# Patient Record
Sex: Female | Born: 1985
Health system: Southern US, Community
[De-identification: ages and names within clinical notes are randomized; demographics above are authoritative.]

## PROBLEM LIST (undated history)

## (undated) DIAGNOSIS — Z789 Other specified health status: Secondary | ICD-10-CM

## (undated) HISTORY — DX: Other specified health status: Z78.9

## (undated) HISTORY — PX: WISDOM TOOTH EXTRACTION: SHX21

## (undated) HISTORY — PX: NO PAST SURGERIES: SHX2092

---

## 2017-05-11 ENCOUNTER — Encounter: Payer: Self-pay | Admitting: Family Medicine

## 2017-05-11 ENCOUNTER — Ambulatory Visit: Payer: BLUE CROSS/BLUE SHIELD | Admitting: Family Medicine

## 2017-05-11 VITALS — BP 102/64 | HR 82 | Temp 98.0°F | Ht 66.0 in | Wt 134.5 lb

## 2017-05-11 DIAGNOSIS — Z0000 Encounter for general adult medical examination without abnormal findings: Secondary | ICD-10-CM | POA: Diagnosis not present

## 2017-05-11 DIAGNOSIS — R300 Dysuria: Secondary | ICD-10-CM

## 2017-05-11 DIAGNOSIS — E559 Vitamin D deficiency, unspecified: Secondary | ICD-10-CM | POA: Diagnosis not present

## 2017-05-11 LAB — LIPID PANEL
CHOL/HDL RATIO: 3
Cholesterol: 130 mg/dL (ref 0–200)
HDL: 51.8 mg/dL (ref >39.00)
LDL CALC: 58 mg/dL (ref 0–99)
NonHDL: 78.34
TRIGLYCERIDES: 103 mg/dL (ref 0.0–149.0)
VLDL: 20.6 mg/dL (ref 0.0–40.0)

## 2017-05-11 LAB — POC URINALSYSI DIPSTICK (AUTOMATED)
Bilirubin, UA: NEGATIVE
Blood, UA: NEGATIVE
GLUCOSE UA: NEGATIVE
KETONES UA: NEGATIVE
LEUKOCYTES UA: NEGATIVE
Nitrite, UA: NEGATIVE
PROTEIN UA: NEGATIVE
SPEC GRAV UA: 1.01 (ref 1.010–1.025)
Urobilinogen, UA: 0.2 E.U./dL
pH, UA: 7.5 (ref 5.0–8.0)

## 2017-05-11 LAB — COMPREHENSIVE METABOLIC PANEL
ALBUMIN: 4.2 g/dL (ref 3.5–5.2)
ALT: 22 U/L (ref 0–35)
AST: 23 U/L (ref 0–37)
Alkaline Phosphatase: 48 U/L (ref 39–117)
BUN: 8 mg/dL (ref 6–23)
CALCIUM: 9.6 mg/dL (ref 8.4–10.5)
CHLORIDE: 103 meq/L (ref 96–112)
CO2: 31 meq/L (ref 19–32)
CREATININE: 0.6 mg/dL (ref 0.40–1.20)
GFR: 124.27 mL/min (ref >60.00)
Glucose, Bld: 92 mg/dL (ref 70–99)
POTASSIUM: 3.8 meq/L (ref 3.5–5.1)
Sodium: 138 mEq/L (ref 135–145)
Total Bilirubin: 0.3 mg/dL (ref 0.2–1.2)
Total Protein: 7.3 g/dL (ref 6.0–8.3)

## 2017-05-11 LAB — CBC
HEMATOCRIT: 40.8 % (ref 36.0–46.0)
HEMOGLOBIN: 13.4 g/dL (ref 12.0–15.0)
MCHC: 32.9 g/dL (ref 30.0–36.0)
MCV: 95.9 fl (ref 78.0–100.0)
PLATELETS: 250 10*3/uL (ref 150.0–400.0)
RBC: 4.26 Mil/uL (ref 3.87–5.11)
RDW: 12.7 % (ref 11.5–15.5)
WBC: 5.8 10*3/uL (ref 4.0–10.5)

## 2017-05-11 LAB — VITAMIN D 25 HYDROXY (VIT D DEFICIENCY, FRACTURES): VITD: 24.79 ng/mL — ABNORMAL LOW (ref 30.00–100.00)

## 2017-05-11 NOTE — Progress Notes (Signed)
Chief Complaint  Patient presents with  . Establish Care     Well Woman Brandi Murray is here for a complete physical.   Her last physical was >1 year ago.  Current diet: in general, a "healthy" diet. Current exercise: walk. Weight is stable and she denies daytime fatigue. No LMP recorded.  Seatbelt? Yes  Health Maintenance Tetanus- Yes; does not remember  HIV screening- Yes- 6 mo ago  Past Medical History:  Diagnosis Date  . No known health problems      Past Surgical History:  Procedure Laterality Date  . NO PAST SURGERIES      Medications  Current Outpatient Medications on File Prior to Visit  Medication Sig Dispense Refill  . cholecalciferol (VITAMIN D) 1000 units tablet Take 1,000 Units by mouth daily.    Marland Kitchen loratadine (CLARITIN) 10 MG tablet Take 10 mg by mouth daily.    . Prenatal Multivit-Min-Fe-FA (PRENATAL VITAMINS PO) Take 1 tablet by mouth.     Allergies No Known Allergies  Review of Systems: Constitutional:  no fevers Eye:  no recent significant change in vision Ear/Nose/Mouth/Throat:  Ears:  no tinnitus or vertigo and no recent change in hearing, Nose/Mouth/Throat:  no complaints of nasal congestion, no sore throat Cardiovascular: no chest pain, no palpitations Respiratory:  no cough and no shortness of breath Gastrointestinal:  no abdominal pain, no change in bowel habits, no significant change in appetite, no nausea, vomiting, diarrhea, or constipation and no black or bloody stool GU:  Female: +dysuria, neg for frequency, and incontinence; no abnormal bleeding, pelvic pain, or discharge Musculoskeletal/Extremities:  no pain of the joints Integumentary (Skin/Breast):  no abnormal skin lesions reported Neurologic:  no headaches Endocrine:  denies fatigue Hematologic/Lymphatic:  no unexpected weight changes  Exam BP 102/64 (BP Location: Left Arm, Patient Position: Sitting, Cuff Size: Normal)   Pulse 82   Temp 98 F (36.7 C) (Oral)   Ht 5\' 6"  (1.676  m)   Wt 134 lb 8 oz (61 kg)   SpO2 94%   BMI 21.71 kg/m  General:  well developed, well nourished, in no apparent distress Skin:  no significant moles, warts, or growths Head:  no masses, lesions, or tenderness Eyes:  pupils equal and round, sclera anicteric without injection Ears:  canals without lesions, TMs shiny without retraction, no obvious effusion, no erythema Nose:  nares patent, septum midline, mucosa normal, and no drainage or sinus tenderness Throat/Pharynx:  lips and gingiva without lesion; tongue and uvula midline; non-inflamed pharynx; no exudates or postnasal drainage Neck: neck supple without adenopathy, thyromegaly, or masses Breasts:  Not done Thorax:  nontender Lungs:  clear to auscultation, breath sounds equal bilaterally, no respiratory distress Cardio:  regular rate and rhythm without murmurs, heart sounds without clicks or rubs, point of maximal impulse normal; no lifts, heaves, or thrills Abdomen:  abdomen soft, nontender; bowel sounds normal; no masses or organomegaly Genital: Defer to GYN Musculoskeletal:  symmetrical muscle groups noted without atrophy or deformity Extremities:  no clubbing, cyanosis, or edema, no deformities, no skin discoloration Neuro:  gait normal; deep tendon reflexes normal and symmetric Psych: well oriented with normal range of affect and appropriate judgment/insight  Assessment and Plan  Well adult exam - Plan: CBC, Comprehensive metabolic panel, Lipid panel  Vitamin D insufficiency - Plan: Vitamin D (25 hydroxy)  Dysuria - Plan: POCT Urinalysis Dipstick (Automated), Urine Culture   Well 32 y.o. female. Counseled on diet and exercise. UA neg. Ck culture.  Other orders as  above. Info for OB/GYN across hall given. Follow up in 1 yr or prn. The patient voiced understanding and agreement to the plan.  Jilda Rocheicholas Paul DaleWendling, DO 05/11/17 3:11 PM

## 2017-05-11 NOTE — Patient Instructions (Addendum)
Call Center for [facility] at [phone] for an appointment.  They are located at [address], [address], [place], [place], [phone] (right across the hall from our office).  Give us 2-3 business days to get the results of your labs back. If labs are normal, you will likely receive a letter in the mail unless you have MyChart. This can take longer than 2-3 business days.   We will let you know the results of the urine by the end of the day. If everything is normal, be diligent with your water intake.   Great work with walking. Consider lifting weights.   Let us know if you need anything.

## 2017-05-11 NOTE — Progress Notes (Signed)
Pre visit review using our clinic review tool, if applicable. No additional management support is needed unless otherwise documented below in the visit note. 

## 2017-05-12 ENCOUNTER — Other Ambulatory Visit: Payer: Self-pay | Admitting: Family Medicine

## 2017-05-12 DIAGNOSIS — E559 Vitamin D deficiency, unspecified: Secondary | ICD-10-CM

## 2017-05-12 LAB — URINE CULTURE
MICRO NUMBER: 90053920
Result:: NO GROWTH
SPECIMEN QUALITY:: ADEQUATE

## 2017-05-13 ENCOUNTER — Telehealth: Payer: Self-pay | Admitting: *Deleted

## 2017-05-13 NOTE — Telephone Encounter (Signed)
[facility]; forwarded to provider/SLS 01/16

## 2017-06-01 ENCOUNTER — Encounter: Payer: Self-pay | Admitting: Obstetrics & Gynecology

## 2017-06-01 ENCOUNTER — Ambulatory Visit: Payer: BLUE CROSS/BLUE SHIELD | Admitting: Obstetrics & Gynecology

## 2017-06-01 VITALS — BP 117/65 | HR 70 | Ht 66.0 in | Wt 133.0 lb

## 2017-06-01 DIAGNOSIS — N979 Female infertility, unspecified: Secondary | ICD-10-CM

## 2017-06-01 DIAGNOSIS — Z3169 Encounter for other general counseling and advice on procreation: Secondary | ICD-10-CM | POA: Diagnosis not present

## 2017-06-01 DIAGNOSIS — N946 Dysmenorrhea, unspecified: Secondary | ICD-10-CM | POA: Diagnosis not present

## 2017-06-01 NOTE — Progress Notes (Signed)
Patient states she has been trying to get pregnant since April 2018. Brandi StammerJennifer Wakisha Alberts RN

## 2017-06-01 NOTE — Progress Notes (Signed)
Subjective:     Brandi Murray is a 32 y.o. female here for a routine exam. G0 LMP 05/23/2017 q26-30 days. Current complaints: none.  Married. Attempting conception since April 2018 (~848months). Pt husband no children.  Both are healthy. Pt used the Nuvaring for <2 years and stopped in 2016 but, used the withdrawal method for 2 years. Stopped using the withdrawal method in April of 2018. Pts husband has a semens analysis which showed decreased sperm concentration and morphology but, normal motility. He is scheduled to see the urologist. He was supposed to see the urology. He has not seen urology and or had the second test.     Pt reports that her menses are the same monthly. She reports mild pain not requiring meds. Pt reports getting angry around the time of her menses.        Gynecologic History Patient's last menstrual period was 06/23/2016. Contraception: none [place] Results were: normal Last mammogram: n/a.  Obstetric History OB History  Gravida Para Term Preterm AB Living  0 0 0 0 0 0  SAB TAB Ectopic Multiple Live Births  0 0 0 0 0       The following portions of the patient's history were reviewed and updated as appropriate: allergies, current medications, past family history, past medical history, past social history, past surgical history and problem list.  Review of Systems Pertinent items are noted in HPI.    Objective:  BP 117/65   Pulse 70   Ht 5\' 6"  (1.676 m)   Wt 133 lb (60.3 kg)   LMP 06/23/2016   BMI 21.47 kg/m  CONSTITUTIONAL: Well-developed, well-nourished female in no acute distress.  HENT:  Normocephalic, atraumatic EYES: Conjunctivae and EOM are normal. No scleral icterus.  NECK: Normal range of motion SKIN: Skin is warm and dry. No rash noted. Not diaphoretic.No pallor. NEUROLGIC: Alert and oriented to person, place, and time. Normal coordination.    Assessment:   Inability to conceive for 8 months. Pt had questions re conception  and ovulation. She felt that she did not really know what the process was Abnormal semen analysis in spouse.   I suspect ovulatory cycles given pts sx.     Plan:   Reviewed ovulation and conception Rec repeat semen analysis.  I have offered to order this if pt has difficulty obtained the results.  I do not think that pt needs lab tests done at this time. She concurred. She will fax me the results of her husbands semen analysis F/u in 4 months or sooner prn Cont pnv  Total face-to-face time with patient was 30 min.  Greater than 50% was spent in counseling and coordination of care with the patient.   Mohamed Portlock L. Harraway-Smith, M.D., Evern CoreFACOG

## 2017-08-10 ENCOUNTER — Other Ambulatory Visit (INDEPENDENT_AMBULATORY_CARE_PROVIDER_SITE_OTHER): Payer: BLUE CROSS/BLUE SHIELD

## 2017-08-10 DIAGNOSIS — E559 Vitamin D deficiency, unspecified: Secondary | ICD-10-CM

## 2017-08-10 LAB — VITAMIN D 25 HYDROXY (VIT D DEFICIENCY, FRACTURES): VITD: 30.75 ng/mL (ref 30.00–100.00)

## 2017-08-12 ENCOUNTER — Other Ambulatory Visit: Payer: Self-pay | Admitting: Family Medicine

## 2017-08-12 DIAGNOSIS — E559 Vitamin D deficiency, unspecified: Secondary | ICD-10-CM

## 2017-09-04 ENCOUNTER — Ambulatory Visit: Payer: BLUE CROSS/BLUE SHIELD | Admitting: Obstetrics & Gynecology

## 2017-09-04 ENCOUNTER — Encounter: Payer: Self-pay | Admitting: Obstetrics & Gynecology

## 2017-09-04 VITALS — BP 102/64 | HR 77 | Ht 66.0 in | Wt 132.0 lb

## 2017-09-04 DIAGNOSIS — N979 Female infertility, unspecified: Secondary | ICD-10-CM

## 2017-09-04 NOTE — Patient Instructions (Signed)
Clomiphene tablets Qu es este medicamento? El CLOMIFENO es un medicamento para la fertilidad que se utiliza para aumentar la posibilidad de quedar embarazada. Se usa para estimular una ovulacin (producir un huevo maduro) adecuada durante el ciclo de la mujer. Este medicamento puede ser utilizado para otros usos; si tiene alguna pregunta consulte con su proveedor de atencin mdica o con su farmacutico. MARCAS COMUNES: Clomid, Serophene Qu le debo informar a mi profesional de la salud antes de tomar este medicamento? Necesita saber si usted presenta alguno de los siguientes problemas o situaciones: -enfermedad de la glndula suprarrenal -enfermedad vascular, trastorno de coagulacin sangunea -quiste en los ovarios -endometriosis -enfermedad heptica -carcinoma de ovario -enfermedad de la glndula pituitaria -sangrado vaginal que no ha sido evaluado -una reaccin alrgica o inusual al clomifeno, a otros medicamentos, alimentos, colorantes o conservantes -si est embarazada (no debe usar si est embarazada) -si est amamantando a un beb Cmo debo utilizar este medicamento? Tome este medicamento por va oral con un vaso de agua. Siga las instrucciones de la etiqueta del medicamento. Tomar exactamente segn se indica y durante el nmero exacto de das para los que fue recetado. Tome sus dosis a intervalos regulares. La mayora de las mujeres toman este medicamento durante un perodo de 5 das, pero la duracin del tratamiento puede ajustarse en algunos casos. Su mdico le indicarn el da en que debe empezar a tomar este medicamento y le darn las indicaciones para el seguimiento. No tome su medicamento con una frecuencia mayor que la indicada. Hable con su pediatra para informarse acerca del uso de este medicamento en nios. Puede requerir atencin especial. Sobredosis: Pngase en contacto inmediatamente con un centro toxicolgico o una sala de urgencia si usted cree que haya tomado  demasiado medicamento. ATENCIN: Este medicamento es solo para usted. No comparta este medicamento con nadie. Qu sucede si me olvido de una dosis? Si olvida una dosis, tmela lo antes posible. Si es casi la hora de la prxima dosis, tome slo esa dosis. No tome dosis adicionales o dobles. Qu puede interactuar con este medicamento? -suplementos a base de hierbas o dietticos como cohosh azul, cohosh negro, vitex o DHEA -prasterona Puede ser que esta lista no menciona todas las posibles interacciones. Informe a su profesional de la salud de todos los productos a base de hierbas, medicamentos de venta libre o suplementos nutritivos que est tomando. Si usted fuma, consume bebidas alcohlicas o si utiliza drogas ilegales, indqueselo tambin a su profesional de la salud. Algunas sustancias pueden interactuar con su medicamento. A qu debo estar atento al usar este medicamento? Asegrese que usted sepa cmo y cundo usar este medicamento. Debe saber cundo est ovulando y cundo debe tener relaciones sexuales a fin de incrementar las posibilidades de quedar embarazada. Visite a su mdico o a su profesional de la salud para chequear su evolucin peridicamente. Es posible que deba controlar sus niveles de hormonas en la sangre o que le indique alguna prueba de orina domiciliaria para controlar la ovulacin. Trate de no faltar a las citas. En comparacin con otros tratamientos para la fertilidad, este medicamento no aumenta mucho la posibilidad de tener un embarazo mltiple. Aproximadamente 5 de cada 100 mujeres que toman este medicamento tienen la posibilidad de quedar embarazadas con mellizos. Si piensa que est embarazada deje de tomar este medicamento de inmediato y comunquese con su mdico o con su profesional de la salud. Este medicamento no es para tratamientos a largo plazo. La mayora de las mujeres que se benefician   del uso de este medicamento obtienen resultados dentro de los tres primeros  ciclos (meses). Su mdico o su profesional de la salud volver a evaluar su problema. Este medicamento generalmente se utiliza durante no ms de 6 ciclos de tratamiento. Puede experimentar mareos o somnolencia. No conduzca ni utilice maquinaria ni haga nada que le exija permanecer en estado de alerta hasta que sepa cmo le afecta este medicamento. No se siente ni se ponga de pie con rapidez. Esto reduce el riesgo de mareos o desmayos. El consumir bebidas alcohlicas o el fumar tabaco puede disminuir las posibilidades de quedar embarazada. Limitar o dejar de consumir alcohol o el uso de tabaco durante su tratamiento para la fertilidad. Qu efectos secundarios puedo tener al utilizar este medicamento? Efectos secundarios que debe informar a su mdico o a su profesional de la salud tan pronto como sea posible: -reacciones alrgicas como erupcin cutnea, picazn o urticarias, hinchazn de la cara, labios o lengua -problemas respiratorios -cambios en la visin -retencin de lquidos -nuseas, vmito -hinchazn o dolor plvico -dolor abdominal severo -aumento de peso repentino Efectos secundarios que, por lo general, no requieren atencin mdica (debe informarlos a su mdico o a su profesional de la salud si persisten o si son molestos): -molestia en las mamas -sofocos -molestia plvica leve -nuseas leves Puede ser que esta lista no menciona todos los posibles efectos secundarios. Comunquese a su mdico por asesoramiento mdico sobre los efectos secundarios. Usted puede informar los efectos secundarios a la FDA por telfono al 1-800-FDA-1088. Dnde debo guardar mi medicina? Mantngala fuera del alcance de los nios. Gurdela a temperatura ambiente, entre 15 y 30 grados C (59 y 86 grados F). Protjala del calor, la luz y la humedad. Deseche todo el medicamento que no haya utilizado, despus de la fecha de vencimiento. ATENCIN: Este folleto es un resumen. Puede ser que no cubra toda la posible  informacin. Si usted tiene preguntas acerca de esta medicina, consulte con su mdico, su farmacutico o su profesional de la salud.  2018 Elsevier/Gold Standard (2014-06-06 00:00:00)  

## 2017-09-04 NOTE — Progress Notes (Signed)
History:  32 y.o. G0P0000 here today for eval of inability to conceive. She reports that her husband went to the urologist for a repeat semen analysis and report that he is fine. She reports that her she started following her ovulation and has still not conceived. She feels that she can tell when she ovulates. There is no change in her clonically since her last visit.     The following portions of the patient's history were reviewed and updated as appropriate: allergies, current medications, past family history, past medical history, past social history, past surgical history and problem list.  Review of Systems:  Pertinent items are noted in HPI.    Objective:  Physical Exam Blood pressure 102/64, pulse 77, height  (1.676 m), weight 132 lb (59.9 kg), last menstrual period 08/10/2017.  CONSTITUTIONAL: Well-developed, well-nourished female in no acute distress.  HENT:  Normocephalic, atraumatic EYES: Conjunctivae and EOM are normal. No scleral icterus.  NECK: Normal range of motion SKIN: Skin is warm and dry. No rash noted. Not diaphoretic.No pallor. NEUROLGIC: Alert and oriented to person, place, and time. Normal coordination.   Labs and Imaging No results found.  Assessment & Plan:  Primary infertility.   Spouse was seen by urology and said there is no problem  Lab eval: TSH, DHEAS, Prolactin, FSH/LH, testosterone today   Pt will decide if she want to use Clomid if her labs are WNL.  She will call for meds after labs. I have reviewed with her the potential side effect and gave her a handout to review.  She will go over this with her spouse. Pt will send MyChart msg if she want to begin meds F/u in 4 months or sooner prn.   Total face-to-face time with patient was 18 min.  Greater than 50% was spent in counseling and coordination of care with the patient.    Peyson Delao L. Harraway-Smith, M.D., [facility]

## 2017-09-05 LAB — FOLLICLE STIMULATING HORMONE: FSH: 3.4 m[IU]/mL

## 2017-09-05 LAB — TESTOSTERONE: TESTOSTERONE: 4 ng/dL — AB (ref 8–48)

## 2017-09-05 LAB — LUTEINIZING HORMONE: LH: 4.9 m[IU]/mL

## 2017-09-05 LAB — DHEA-SULFATE: DHEA-SO4: 74.6 ug/dL — ABNORMAL LOW (ref 84.8–378.0)

## 2017-09-05 LAB — TSH: TSH: 1.56 u[IU]/mL (ref 0.450–4.500)

## 2017-09-05 LAB — PROLACTIN: PROLACTIN: 18.6 ng/mL (ref 4.8–23.3)

## 2017-09-07 ENCOUNTER — Encounter: Payer: Self-pay | Admitting: Obstetrics & Gynecology

## 2017-11-11 ENCOUNTER — Other Ambulatory Visit (INDEPENDENT_AMBULATORY_CARE_PROVIDER_SITE_OTHER): Payer: BLUE CROSS/BLUE SHIELD

## 2017-11-11 DIAGNOSIS — E559 Vitamin D deficiency, unspecified: Secondary | ICD-10-CM

## 2017-11-11 LAB — VITAMIN D 25 HYDROXY (VIT D DEFICIENCY, FRACTURES): VITD: 34.77 ng/mL (ref 30.00–100.00)

## 2018-01-25 ENCOUNTER — Encounter: Payer: Self-pay | Admitting: Obstetrics & Gynecology

## 2018-01-25 ENCOUNTER — Ambulatory Visit (INDEPENDENT_AMBULATORY_CARE_PROVIDER_SITE_OTHER): Payer: BLUE CROSS/BLUE SHIELD | Admitting: Obstetrics & Gynecology

## 2018-01-25 VITALS — BP 106/59 | HR 63 | Ht 66.0 in | Wt 128.1 lb

## 2018-01-25 DIAGNOSIS — Z01419 Encounter for gynecological examination (general) (routine) without abnormal findings: Secondary | ICD-10-CM | POA: Diagnosis not present

## 2018-01-25 DIAGNOSIS — N979 Female infertility, unspecified: Secondary | ICD-10-CM

## 2018-01-25 DIAGNOSIS — Z124 Encounter for screening for malignant neoplasm of cervix: Secondary | ICD-10-CM | POA: Diagnosis not present

## 2018-01-25 DIAGNOSIS — Z1151 Encounter for screening for human papillomavirus (HPV): Secondary | ICD-10-CM | POA: Diagnosis not present

## 2018-01-25 DIAGNOSIS — N944 Primary dysmenorrhea: Secondary | ICD-10-CM

## 2018-01-25 NOTE — Progress Notes (Signed)
Subjective:     Brandi Murray is a 32 y.o. female here for a routine exam.G0  LMP 01/10/2018 Pt reports 26-29 day cycles that lasting 4-5 days. Cycles are painful. There is some pain between cycles- at ovulation. No pain with intercourse or daily activities. Pt sexually active for 1 1/2 with no conception and no birth control.  Pts spouse had a semen analysis <1 year prev that was WNL.  Pt denies h/o pelvic infections.     Gynecologic History Patient's last menstrual period was 01/10/2018. Contraception: none Last Pap: 11/2017. Results were: normal Last mammogram: n/a.  Obstetric History OB History  Gravida Para Term Preterm AB Living  0 0 0 0 0 0  SAB TAB Ectopic Multiple Live Births  0 0 0 0 0   The following portions of the patient's history were reviewed and updated as appropriate: allergies, current medications, past family history, past medical history, past social history, past surgical history and problem list.  Review of Systems Pertinent items are noted in HPI.    Objective:  BP (!) 106/59   Pulse 63   Ht 5\' 6"  (1.676 m)   Wt 128 lb 1.3 oz (58.1 kg)   LMP 01/10/2018   BMI 20.67 kg/m   General Appearance:    Alert, cooperative, no distress, appears stated age  Head:    Normocephalic, without obvious abnormality, atraumatic  Eyes:    conjunctiva/corneas clear, EOM's intact, both eyes  Ears:    Normal external ear canals, both ears  Nose:   Nares normal, septum midline, mucosa normal, no drainage    or sinus tenderness  Throat:   Lips, mucosa, and tongue normal; teeth and gums normal  Neck:   Supple, symmetrical, trachea midline, no adenopathy;    thyroid:  no enlargement/tenderness/nodules  Back:     Symmetric, no curvature, ROM normal, no CVA tenderness  Lungs:     Clear to auscultation bilaterally, respirations unlabored  Chest Wall:    No tenderness or deformity   Heart:    Regular rate and rhythm, S1 and S2 normal, no murmur, rub   or gallop  Breast Exam:    No  tenderness, masses, or nipple abnormality  Abdomen:     Soft, non-tender, bowel sounds active all four quadrants,    no masses, no organomegaly  Genitalia:    Normal female without lesion, discharge or tenderness; uterus- small; mobile.        Extremities:   Extremities normal, atraumatic, no cyanosis or edema  Pulses:   2+ and symmetric all extremities  Skin:   Skin color, texture, turgor normal, no rashes or lesions     Assessment:    Healthy female exam.   Primary infertility- pt wants to confirm that she is healthy. Lab eval was WNL. She wants to eval for tubal patency. Her partner has a normal semen analysis within the year.   She was taking PNV but, felts increased pain with ovulation so stopped them      Plan:   Scheduled HSG F/u in 6 weeks Reviewed timing of intercourse.   Pt offered Letrozole or clomid. She will decide after HSG. She is worried about side effects PV- reviewed benefits.   F/u PAP  Tessi Eustache L. Harraway-Smith, M.D., [facility]

## 2018-01-25 NOTE — Patient Instructions (Signed)
Hysterosalpingography Hysterosalpingography is a procedure to look inside your uterus and fallopian tubes. During this procedure, contrast dye is injected into your uterus through your vagina and cervix to illuminate your uterus while X-ray pictures are taken. This procedure may help your health care provider determine whether you have uterine tumors, adhesions, or structural abnormalities. It is commonly used to help determine why a woman is unable to have children (infertility). The procedure usually lasts about 15-30 minutes. LET YOUR HEALTH CARE PROVIDER KNOW ABOUT:  Any allergies you have.  All medicines you are taking, including vitamins, herbs, eye drops, creams, and over-the-counter medicines.  Previous problems you or members of your family have had with the use of anesthetics.  Any blood disorders you have.  Previous surgeries you have had.  Medical conditions you have. RISKS AND COMPLICATIONS Generally, this is a safe procedure. However, as with any procedure, problems can occur. Possible problems include:  Infection in the lining of the uterus (endometritis) or fallopian tubes (salpingitis).  Damage or perforation of the uterus or fallopian tubes.  An allergic reaction to the contrast dye used to perform the X-ray.  BEFORE THE PROCEDURE  Schedule the procedure after your period stops, but before your next ovulation. This is usually between day 5 and day 10 of your last period. Day 1 is the first day of your period.  Ask your health care provider about changing or stopping your regular medicines.  You may eat and drink as normal.  Empty your bladder before the procedure begins. PROCEDURE  You may be given a medicine to relax you (sedative) or an over-the-counter pain medicine to lessen any discomfort during the procedure.  You will lie down on an X-ray table with your feet in stirrups.  A device called a speculum will be placed into your vagina. This allows your  health care provider to see inside your vagina to the cervix.  The cervix will be washed with a special soap.  A thin, flexible tube will be passed through the cervix into the uterus.  Contrast dye will be put into this tube.  Several X-rays will be taken as the contrast dye spreads through the uterus and fallopian tubes.  The tube will be taken out after the procedure. What to expect after the procedure  Most of the contrast dye will flow out of your vagina naturally. You may want to wear a sanitary pad.  You may feel mild cramping and notice a little bleeding from your vagina. This should go away in 24 hours.  Ask when your test results will be ready. Make sure you get your test results. This information is not intended to replace advice given to you by your health care provider. Make sure you discuss any questions you have with your health care provider. Document Released: 05/17/2004 Document Revised: 09/20/2015 Document Reviewed: 10/15/2012 Elsevier Interactive Patient Education  2017 Elsevier Inc.  

## 2018-01-26 ENCOUNTER — Telehealth: Payer: Self-pay | Admitting: Obstetrics & Gynecology

## 2018-01-26 NOTE — Telephone Encounter (Signed)
Called patient after attempted to schedule her HSG study.  This imaging study can only be done 7-10 after her menstrual cycle.  Patient was given the scheduling direct number.  She will contact them on the first day of her next period to schedule this procedure.    Number to Central scheduling is [phone].

## 2018-01-26 NOTE — Addendum Note (Signed)
Addended by: Anell Barr on: 01/26/2018 11:35 AM   Modules accepted: Orders

## 2018-01-27 LAB — CYTOLOGY - PAP
Diagnosis: NEGATIVE
HPV (WINDOPATH): NOT DETECTED

## 2018-02-12 ENCOUNTER — Ambulatory Visit (HOSPITAL_COMMUNITY)
Admission: RE | Admit: 2018-02-12 | Discharge: 2018-02-12 | Disposition: A | Discharge status: Home / Self Care | Payer: BLUE CROSS/BLUE SHIELD | Source: Ambulatory Visit | Attending: Obstetrics & Gynecology | Admitting: Obstetrics & Gynecology

## 2018-02-12 DIAGNOSIS — N979 Female infertility, unspecified: Secondary | ICD-10-CM | POA: Diagnosis not present

## 2018-02-12 MED ORDER — IOPAMIDOL (ISOVUE-300) INJECTION 61%
25.0000 mL | Freq: Once | INTRAVENOUS | Status: AC | PRN
  Administered 2018-02-12: 5 mL

## 2018-03-10 ENCOUNTER — Encounter: Payer: Self-pay | Admitting: Obstetrics & Gynecology

## 2018-03-10 ENCOUNTER — Ambulatory Visit (INDEPENDENT_AMBULATORY_CARE_PROVIDER_SITE_OTHER): Payer: BLUE CROSS/BLUE SHIELD | Admitting: Obstetrics & Gynecology

## 2018-03-10 VITALS — BP 108/70 | HR 62 | Ht 66.0 in | Wt 134.1 lb

## 2018-03-10 DIAGNOSIS — N926 Irregular menstruation, unspecified: Secondary | ICD-10-CM | POA: Diagnosis not present

## 2018-03-10 DIAGNOSIS — Z712 Person consulting for explanation of examination or test findings: Secondary | ICD-10-CM | POA: Diagnosis not present

## 2018-03-10 NOTE — Progress Notes (Signed)
History:  32 y.o. G0P0000 here today for results. Pt has an HSG on 02/12/2018. She reports no problems since the exam.  She does reports that her last cycle was without pain. She normally has pain with er cycles. It was otherwise WNL.   The following portions of the patient's history were reviewed and updated as appropriate: allergies, current medications, past family history, past medical history, past social history, past surgical history and problem list.  Review of Systems:  Pertinent items are noted in HPI.    Objective:  Physical Exam Blood pressure 108/70, pulse 62, height 5\' 6"  (1.676 m), weight 134 lb 1.3 oz (60.8 kg), last menstrual period 02/28/2018.  CONSTITUTIONAL: Well-developed, well-nourished female in no acute distress.  HENT:  Normocephalic, atraumatic EYES: Conjunctivae and EOM are normal. No scleral icterus.  NECK: Normal range of motion SKIN: Skin is warm and dry. No rash noted. Not diaphoretic.No pallor. NEUROLGIC: Alert and oriented to person, place, and time. Normal coordination.    Labs and Imaging Dg Hysterogram (hsg)  Result Date: 02/12/2018 CLINICAL DATA:  Primary infertility. EXAM: HYSTEROSALPINGOGRAM TECHNIQUE: Following cleansing of the cervix and vagina with Betadine solution, a hysterosalpingogram was performed using a 5-French hysterosalpingogram catheter and Omnipaque 300 contrast. The patient tolerated the examination without difficulty. COMPARISON:  None. FLUOROSCOPY TIME:  Fluoroscopy Time:  2 minutes 12 seconds Number of Acquired Images:  0 FINDINGS: Endometrial Cavity: Normal appearance. No signs of Mullerian duct anomaly or other significant abnormality. Right Fallopian Tube: Normal appearance. Free intraperitoneal spill of contrast is demonstrated. Left Fallopian Tube: Normal appearance. Free intraperitoneal spill of contrast is demonstrated. Other:  None. IMPRESSION: Normal study. Both fallopian tubes are patent. Electronically Signed   By: Myles RosenthalJohn   Stahl M.D.   On: 02/12/2018 15:23    Assessment & Plan:  F/u HSG results- Normal HSG.    Reiwed cycles and inability to conceive. I have reviewed with pt Letrozole and Clomid. She wants to wait another 3-6 months to see if she conceives after the HSG.  All questions answered.   She had a in person interpreter with her for this exam    Total face-to-face time with patient was 20 min.  Greater than 50% was spent in counseling and coordination of care with the patient.   Audra Bellard L. Harraway-Smith, M.D., [facility]ACOG

## 2018-03-10 NOTE — Progress Notes (Signed)
Language resources interpreter Vernona RiegerLaura

## 2018-03-10 NOTE — Patient Instructions (Addendum)
Clomiphene tablets Qu es este medicamento? El CLOMIFENO es un medicamento para la fertilidad que se [place] para aumentar la posibilidad de [place]. [place] para estimular Nurse, children's (producir un huevo maduro) adecuada durante el ciclo de la [place]. Este medicamento puede ser utilizado para otros usos; si tiene alguna pregunta consulte con su proveedor de atencin mdica o con su farmacutico. MARCAS COMUNES: Clomid, Serophene Qu le debo informar a mi profesional de la salud antes de tomar este medicamento? Necesita saber si usted presenta alguno de los siguientes problemas o situaciones: -enfermedad de la glndula suprarrenal -enfermedad vascular, trastorno de coagulacin sangunea -quiste en los ovarios -endometriosis -enfermedad heptica -carcinoma de ovario -enfermedad de la glndula pituitaria -sangrado vaginal que no ha sido evaluado -una reaccin alrgica o inusual al clomifeno, a otros medicamentos, alimentos, colorantes o conservantes -si est embarazada (no debe usar si est embarazada) -si est amamantando a un beb Cmo debo utilizar este medicamento? Tome este medicamento por va oral con un vaso de agua. Siga las instrucciones de la etiqueta del Farmer. Tomar exactamente segn se indica y durante el nmero exacto de das para los que fue recetado. Tome sus dosis a intervalos regulares. La [employer] de las mujeres toman este medicamento durante un perodo de 5 [place], Armed forces training and education officer la duracin del tratamiento puede ajustarse en algunos casos. Su mdico le Conservation officer, nature en que debe empezar a tomar este medicamento y Ship broker las indicaciones para Tour manager. No tome su medicamento con una frecuencia mayor que la indicada. Hable con su pediatra para informarse acerca del uso de este medicamento en nios. Puede requerir atencin especial. Sobredosis: Pngase en contacto inmediatamente con un centro toxicolgico o una sala de urgencia si usted cree que haya tomado  demasiado medicamento. ATENCIN: [employer] es solo para usted. No comparta este medicamento con nadie. Qu sucede si me olvido de una dosis? Si olvida una dosis, tmela lo antes posible. Si es casi la hora de la prxima dosis, tome slo esa dosis. No tome dosis adicionales o dobles. Qu puede interactuar con este medicamento? -suplementos a base de hierbas o dietticos como cohosh azul, cohosh negro, vitex o DHEA -prasterona Puede ser que esta lista no menciona todas las posibles interacciones. Informe a su profesional de [employer]	[place] de [employer] productos a base de hierbas, medicamentos de [place] o suplementos nutritivos que est tomando. Si usted fuma, consume bebidas alcohlicas o si utiliza drogas ilegales, indqueselo tambin a su profesional de [employer]	[place]. Algunas sustancias pueden interactuar con su medicamento. A qu debo estar atento al usar Coca-Cola? Asegrese que usted sepa cmo y cundo usar Coca-Cola. Debe saber cundo est ovulando y cundo debe Boeing sexuales a fin de incrementar las posibilidades de quedar [place]. Visite a su mdico o a su profesional de la salud para chequear su evolucin peridicamente. Es posible que deba controlar sus niveles de hormonas en la sangre o que le indique alguna prueba de orina domiciliaria para Aeronautical engineer ovulacin. Trate de no faltar a las citas. En comparacin con otros tratamientos para la fertilidad, este medicamento no aumenta mucho la posibilidad de Best boy un Geneticist, molecular. Aproximadamente 5 de cada 100 mujeres que toman este medicamento tienen la posibilidad de quedar embarazadas con [employer]. Si piensa que est embarazada deje de tomar este medicamento de inmediato y comunquese con su mdico o con su profesional de Technical sales engineer. Este medicamento no es para tratamientos a Barrister's clerk. La mayora de las mujeres que se benefician  del uso de este medicamento obtienen resultados dentro de los tres primeros  ciclos (meses). Su mdico o su profesional de Facilities manager a Financial controller. Este medicamento generalmente se utiliza durante no ms de 6 ciclos de [place]. Puede experimentar mareos o somnolencia. No conduzca ni utilice maquinaria ni haga nada que Scientist, research (life sciences) en estado de alerta hasta que sepa cmo le afecta este medicamento. No se siente ni se ponga de pie con rapidez. Esto reduce el riesgo de mareos o [employer]. El consumir bebidas alcohlicas o el fumar tabaco puede disminuir las posibilidades de [place]. Limitar o dejar de consumir alcohol o el uso de tabaco durante su tratamiento para la fertilidad. Qu efectos secundarios puedo tener al [employer] este medicamento? Efectos secundarios que debe informar a su mdico o a Producer, television/film/video de la salud tan pronto como sea posible: -Therapist, art como erupcin cutnea, picazn o urticarias, hinchazn de la cara, labios o lengua -problemas respiratorios -cambios en la visin -retencin de lquidos -nuseas, vmito -hinchazn o dolor plvico -dolor abdominal severo -aumento de peso repentino Efectos secundarios que, por lo general, no requieren atencin mdica (debe informarlos a su mdico o a su profesional de la salud si persisten o si son molestos): -molestia en las mamas -sofocos -molestia plvica leve -nuseas leves Puede ser que esta lista no menciona todos los posibles efectos secundarios. Comunquese a su mdico por asesoramiento mdico [employer]. Usted puede informar los efectos secundarios a la FDA por telfono al 1-800-FDA-1088. Dnde debo guardar mi medicina? Mantngala fuera del alcance de los nios. Gurdela a [employer], entre 15 y 30 grados C (59 y 80 grados F). Protjala del calor, la luz y la humedad. Deseche todo el medicamento que no haya utilizado, despus de la fecha de vencimiento. ATENCIN: Este folleto es un resumen. Puede ser que no cubra toda la posible  informacin. Si usted tiene preguntas acerca de esta medicina, consulte con su mdico, su farmacutico o su profesional de Radiographer, therapeutic.  2018 Elsevier/Gold Standard (2014-06-06 00:00:00) Clomiphene tablets What is this medicine? CLOMIPHENE (KLOE mi feen) is a fertility drug that increases the chance of pregnancy. It helps women ovulate (produce a mature egg) during their cycle. This medicine may be used for other purposes; ask your health care provider or pharmacist if you have questions. COMMON BRAND NAME(S): Clomid, Serophene What should I tell my health care provider before I take this medicine? They need to know if you have any of these conditions: -adrenal gland disease -blood vessel disease or blood clots -cyst on the ovary -endometriosis -liver disease -ovarian cancer -pituitary gland disease -vaginal bleeding that has not been evaluated -an unusual or allergic reaction to clomiphene, other medicines, foods, dyes, or preservatives -pregnant (should not be used if you are already pregnant) -breast-feeding How should I use this medicine? Take this medicine by mouth with a glass of water. Follow the directions on the prescription label. Take exactly as directed for the exact number of days prescribed. Take your doses at regular intervals. Most women take this medicine for a 5 day period, but the length of treatment may be adjusted. Your doctor will give you a start date for this medication and will give you instructions on proper use. Do not take your medicine more often than directed. Talk to your pediatrician regarding the use of this medicine in children. Special care may be needed. Overdosage: If you think you have taken too much of this medicine contact a poison  control center or emergency room at once. NOTE: This medicine is only for you. Do not share this medicine with others. What if I miss a dose? If you miss a dose, take it as soon as you can. If it is almost time for your next  dose, take only that dose. Do not take double or extra doses. What may interact with this medicine? -herbal or dietary supplements, like blue cohosh, black cohosh, chasteberry, or DHEA -prasterone This list may not describe all possible interactions. Give your health care provider a list of all the medicines, herbs, non-prescription drugs, or dietary supplements you use. Also tell them if you smoke, drink alcohol, or use illegal drugs. Some items may interact with your medicine. What should I watch for while using this medicine? Make sure you understand how and when to use this medicine. You need to know when you are ovulating and when to have sexual intercourse. This will increase the chance of a pregnancy. Visit your doctor or health care professional for regular checks on your progress. You may need tests to check the hormone levels in your blood or you may have to use home-urine tests to check for ovulation. Try to keep any appointments. Compared to other fertility treatments, this medicine does not greatly increase your chances of having multiple babies. An increased chance of having twins may occur in roughly 5 out of every 100 women who take this medication. Stop taking this medicine at once and contact your doctor or health care professional if you think you are pregnant. This medicine is not for long-term use. Most women that benefit from this medicine do so within the first three cycles (months). Your doctor or health care professional will monitor your condition. This medicine is usually used for a total of 6 cycles of treatment. You may get drowsy or dizzy. Do not drive, use machinery, or do anything that needs mental alertness until you know how this drug affects you. Do not stand or sit up quickly. This reduces the risk of dizzy or fainting spells. Drinking alcoholic beverages or smoking tobacco may decrease your chance of becoming pregnant. Limit or stop alcohol and tobacco use during your  fertility treatments. What side effects may I notice from receiving this medicine? Side effects that you should report to your doctor or health care professional as soon as possible: -allergic reactions like skin rash, itching or hives, swelling of the face, lips, or tongue -breathing problems -changes in vision -fluid retention -nausea, vomiting -pelvic pain or bloating -severe abdominal pain -sudden weight gain Side effects that usually do not require medical attention (report to your doctor or health care professional if they continue or are bothersome): -breast discomfort -hot flashes -mild pelvic discomfort -mild nausea This list may not describe all possible side effects. Call your doctor for medical advice about side effects. You may report side effects to FDA at 1-800-FDA-1088. Where should I keep my medicine? Keep out of the reach of children. Store at room temperature between 15 and 30 degrees C (59 and 86 degrees F). Protect from heat, light, and moisture. Throw away any unused medicine after the expiration date. NOTE: This sheet is a summary. It may not cover all possible information. If you have questions about this medicine, talk to your doctor, pharmacist, or health care provider.  2018 Elsevier/Gold Standard (2007-07-26 22:21:06) Infertility Infertility is when you are unable to get pregnant (conceive) after a year of having sex regularly without using birth control. Infertility can  also mean that a woman is not able to carry a pregnancy to full term. Both women and men can have fertility problems. What causes infertility? What Causes Infertility in Women? There are many possible causes of infertility in women. For some women, the cause of infertility is not known (unexplained infertility). Infertility can also be linked to more than one cause. Infertility problems in women can be caused by problems with the menstrual cycle or reproductive organs, certain medical  conditions, and factors related to lifestyle and age.  Problems with your menstrual cycle can interfere with your ovaries producing eggs (ovulation). This can make it difficult to get pregnant. This includes having a menstrual cycle that is very long, very short, or irregular.  Problems with reproductive organs can include: ? An abnormally narrow cervix or a cervix that does not remain closed during a pregnancy. ? A blockage in your fallopian tubes. ? An abnormally shaped uterus. ? Uterine fibroids. This is a tissue mass (tumor) that can develop on your uterus.  Medical conditions that can affect a woman's fertility include: ? Polycystic ovarian syndrome (PCOS). This is a hormonal disorder that can cause small cysts to grow on your ovaries. This is the most common cause of infertility in women. ? Endometriosis. This is a condition in which the tissue that lines your uterus (endometrium) grows outside of its normal location. ? Primary ovary insufficiency. This is when your ovaries stop producing eggs and hormones before the age of 15. ? Sexually transmitted diseases, such as chlamydia or gonorrhea. These infections can cause scarring in your fallopian tubes. This makes it difficult for eggs to reach your uterus. ? Autoimmune disorders. These are disorders in which your immune system attacks normal, healthy cells. ? Hormone imbalances.  Other factors include: ? Age. A woman's fertility declines with age, especially after her mid-30s. ? Being under- or overweight. ? Drinking too much alcohol. ? Using drugs. ? Exercising excessively. ? Being exposed to environmental toxins, such as radiation, pesticides, and certain chemicals.  What Causes Infertility in Men? There are many causes of infertility in men. Infertility can be linked to more than one cause. Infertility problems in men can be caused by problems with sperm or the reproductive organs, certain medical conditions, and factors related to  lifestyle and age. Some men have unexplained infertility.  Problems with sperm. Infertility can result if there is a problem producing: ? Enough sperm (low sperm count). ? Enough normally-shaped sperm (sperm morphology). ? Sperm that are able to reach the egg (poor motility).  Infertility can also be caused by: ? A problem with hormones. ? Enlarged veins (varicoceles), cysts (spermatoceles), or tumors of the testicles. ? Sexual dysfunction. ? Injury to the testicles. ? A birth defect, such as not having the tubes that carry sperm (vas deferens).  Medical conditions that can affect a man's fertility include: ? Diabetes. ? Cancer treatments, such as chemotherapy or radiation. ? Klinefelter syndrome. This is an inherited genetic disorder. ? Thyroid problems, such as an under- or overactive thyroid. ? Cystic fibrosis. ? Sexually transmitted diseases.  Other factors include: ? Age. A man's fertility declines with age. ? Drinking too much alcohol. ? Using drugs. ? Being exposed to environmental toxins, such as pesticides and lead.  What are the symptoms of infertility? Being unable to get pregnant after one year of having regular sex without using birth control is the only sign of infertility. How is infertility diagnosed? In order to be diagnosed with infertility, both  partners will have a physical exam. Both partners will also have an extensive medical and sexual history taken. If there is no obvious reason for infertility, additional tests may be done. What Tests Will Women Have? Women may first have tests to check whether they are ovulating each month. The tests may include:  Blood tests to check hormone levels.  An ultrasound of the ovaries. This looks for possible problems on or in the ovaries.  Taking a small sample of the tissue that lines the uterus for examination under a microscope (endometrial biopsy).  Women who are ovulating may have additional tests. These may  include:  Hysterosalpingography. ? This is an X-ray of the fallopian tubes and uterus taken after a specific type of dye is injected. ? This test can show the shape of the uterus and whether the fallopian tubes are open.  Laparoscopy. ? In this test, a lighted tube (laparoscope) is used to look for problems in the fallopian tubes and other female organs.  Transvaginal ultrasound. ? This is an imaging test to check for abnormalities of the uterus and ovaries. ? A health care provider can use this test to count the number of follicles on the ovaries.  Hysteroscopy. ? This test involves using a lighted tube to examine the cervix and inside the uterus. ? It is done to find any abnormalities inside the uterus.  What Tests Will Men Have? Tests for men's infertility includes:  Semen tests to check sperm count, morphology, and motility.  Blood tests to check for hormone levels.  Taking a small sample of tissue from inside a testicle (biopsy). This is examined under a microscope.  Blood tests to check for genetic abnormalities (genetic testing).  How are women treated for infertility? Treatment depends on the cause of infertility. Most cases of infertility in women are treated with medicine or surgery.  Women may take medicine to: ? Correct ovulation problems. ? Treat other health conditions, such as PCOS.  Surgery may be done to: ? Repair damage to the ovaries, fallopian tubes, cervix, or uterus. ? Remove growths from the uterus. ? Remove scar tissue from the uterus, pelvis, or other female organs.  How are men treated for infertility? Treatment depends on the cause of infertility. Most cases of infertility in men are treated with medicine or surgery.  Men may take medicine to: ? Correct hormone problems. ? Treat other health conditions. ? Treat sexual dysfunction.  Surgery may be done to: ? Remove blockages in the reproductive tract. ? Correct other structural problems of  the reproductive tract.  What is assisted reproductive technology? Assisted reproductive technology (ART) refers to all treatments and procedures that combine eggs and sperm outside the body to try to help a couple conceive. ART is often combined with fertility drugs to stimulate ovulation. Sometimes ART is done using eggs retrieved from another woman's body (donor eggs) or from previously frozen fertilized eggs (embryos). There are different types of ART. These include:  Intrauterine insemination (IUI). ? In this procedure, sperm is placed directly into a woman's uterus with a long, thin tube. ? This may be most effective for infertility caused by sperm problems, including low sperm count and low motility. ? Can be used in combination with fertility drugs.  In vitro fertilization (IVF). ? This is often done when a woman's fallopian tubes are blocked or when a man has low sperm counts. ? Fertility drugs stimulate the ovaries to produce multiple eggs. Once mature, these eggs are removed from  the body and combined with the sperm to be fertilized. ? These fertilized eggs are then placed in the woman's uterus.  This information is not intended to replace advice given to you by your health care provider. Make sure you discuss any questions you have with your health care provider. Document Released: 04/17/2003 Document Revised: 09/14/2015 Document Reviewed: 12/28/2013 Elsevier Interactive Patient Education  2018 [employer]. [place] (Infertility) La infertilidad es no poder Research scientist (physical sciences) (concebir) despus de un ao de Pharmacologist relaciones sexuales regularmente sin usar ningn mtodo de control de la natalidad. La infertilidad tambin puede significar que una mujer no puede llevar el embarazo a trmino. Tanto hombres como mujeres pueden tener problemas de fertilidad. CULES SON LAS CAUSAS DE LA INFERTILIDAD? Cules son las causas de la infertilidad en las mujeres? Hay muchas causas  posibles de infertilidad en las mujeres. En algunas mujeres, no hay ninguna causa aparente de infertilidad (infertilidad idioptica). La infertilidad tambin puede estar vinculada a ms de [place]. Los problemas de infertilidad en las mujeres pueden deberse a problemas en el ciclo menstrual o los rganos reproductivos, ciertas afecciones mdicas y factores relacionados con la edad y el estilo de vida.  Los problemas en el ciclo menstrual pueden interferir con los ovarios que producen los vulos (ovulacin). Esto puede causar dificultad para quedar embarazada e incluye tener un ciclo menstrual muy largo, muy corto o irregular.  Los problemas relacionados con los rganos reproductivos incluyen lo siguiente: ? Un cuello de tero anormalmente angosto o que no permanece cerrado durante el [place]. ? Una obstruccin en las trompas de [place]. ? Un tero de forma anormal. ? Fibromas uterinos. Estos son masas de tejido (tumores) que pueden [place].  Las afecciones mdicas que pueden afectar la fertilidad en las mujeres incluyen lo siguiente: ? Sndrome de ovario poliqustico (SOP). Este es un trastorno hormonal que produce pequeos quistes en los ovarios y es la causa ms frecuente de infertilidad en las mujeres. ? Endometriosis. Es una enfermedad en la que el tejido que recubre el tero (endometrio) crece fuera de su ubicacin normal. ? Insuficiencia ovrica primaria. Esto es cuando los ovarios dejan de producir vulos y hormonas antes de los 40aos de [place]. ? Enfermedades de transmisin sexual (ETS), como la clamidia o la gonorrea. Estas infecciones pueden provocar fibrosis en las trompas de [place], lo que disminuye la probabilidad de que los vulos lleguen al tero. ? Trastornos autoinmunitarios. Estas son afecciones en las que el sistema inmunitario ataca las clulas normales y saludables. ? Desequilibrios hormonales.  Otros factores incluyen los siguientes: ? La edad. La  fertilidad en las mujeres declina con la edad, especialmente despus de los 35aos. ? Tener bajo peso o sobrepeso. ? Beber alcohol en exceso. ? Consumir drogas. ? Hacer ejercicio en exceso. ? La exposicin a toxinas ambientales, como la radiacin, los plaguicidas y ciertos qumicos. Cules son las causas de la infertilidad en los hombres? Hay muchas causas de infertilidad en los hombres. La infertilidad puede estar vinculada a ms de [employer]. Los problemas de infertilidad en los hombres pueden deberse a problemas con los espermatozoides o los rganos reproductivos, ciertas afecciones mdicas y factores relacionados con la edad y el estilo de vida. Algunos hombres tienen infertilidad idioptica.  Problemas con los espermatozoides. La infertilidad puede ser consecuencia de un problema para producir lo siguiente: ? Suficientes espermatozoides (bajo recuento de espermatozoides). ? Suficientes espermatozoides con forma normal (morfologa de los espermatozoides). ? Espermatozoides que puedan llegar al vulo (motilidad  deficiente).  Las causas de la infertilidad tambin incluyen las siguientes: ? Un problema con las hormonas. ? Venas agrandadas (varicocele), quistes (espermatocele) o tumores en los testculos. ? Disfuncin sexual. ? Una lesin en los testculos. ? Un defecto de nacimiento, como no tener los conductos que transportan los espermatozoides (conductos deferentes).  Algunas de las afecciones mdicas que pueden afectar la fertilidad en los hombres son las siguientes: ? Diabetes. ? Oceanographer, como la radioterapia o la quimioterapia. ? El sndrome de Klinefelter. Este es un trastorno gentico hereditario. ? Problemas de tiroides, como una tiroides hipoactiva o hiperactiva. ? Fibrosis qustica. ? Enfermedades de transmisin sexual.  Otros factores incluyen los siguientes: ? La edad. La fertilidad en los hombres declina con la edad. ? Beber alcohol en  exceso. ? Consumir drogas. ? La exposicin a toxinas ambientales, como la radiacin, los plaguicidas y el plomo. CULES SON LOS SNTOMAS DE LA INFERTILIDAD? El nico signo de infertilidad es no poder Research scientist (physical sciences) despus de un ao de Pharmacologist relaciones sexuales regularmente sin usar ningn mtodo de control de la natalidad. CMO SE DIAGNOSTICA LA INFERTILIDAD? Para recibir un diagnstico de infertilidad, ambos integrantes de la pareja se harn a un examen fsico. Tambin se analizar en detalle la historia clnica y sexual de ambos. Si no hay una razn evidente de infertilidad, se harn estudios adicionales. Qu estudios se harn las mujeres? Las mujeres primero pueden hacerse estudios para controlar si ovulan todos los meses. Estos estudios pueden incluir lo siguiente:  Anlisis de sangre para [employer] niveles hormonales.  Una ecografa de los ovarios. Este estudio detecta posibles problemas en los ovarios.  Toma de [place] de tejido que recubre el tero para examinarla en el microscopio (biopsia de endometrio). Las mujeres que ovulan pueden realizarse estudios adicionales. Estos pueden incluir los siguientes:  Histerosalpingografa. ? Es una radiografa de las trompas de Falopio y el tero despus de Tourist information centre manager un tipo especfico de sustancia de [place]. ? Esta prueba puede mostrar la forma del tero y si las trompas de Falopio estn [place].  Laparoscopia. ? En [employer], se [place] un tubo que emite luz (laparoscopio) para Stage manager en las trompas de Falopio y otros rganos femeninos.  Ecografa transvaginal. ? Este es un estudio de diagnstico por imgenes que determina si hay anomalas en el tero y los ovarios. ? El mdico puede utilizar este estudio para contar la cantidad de [employer] ovarios.  Histeroscopa. ? En [employer], se [place] un tubo que emite luz para examinar el cuello y el interior del tero. ? Se realiza para Brewing technologist en el interior del tero. Qu estudios se harn los hombres? Los estudios para Production assistant, radio infertilidad en los hombres incluyen los siguientes:  Anlisis de semen para controlar el recuento, la morfologa y la motilidad de los espermatozoides.  Anlisis de sangre para [employer].  Toma de [place] de tejido del interior de un testculo (biopsia). La muestra se examina en el microscopio.  Anlisis de sangre para Psychologist, sport and exercise genticas (pruebas genticas). CUL ES EL TRATAMIENTO PARA LA INFERTILIDAD EN LAS MUJERES? El tratamiento depende de la causa de la infertilidad. En la [employer], la infertilidad en las mujeres se trata con medicamentos o [place].  Las mujeres pueden tomar medicamentos para: ? Clinical cytogeneticist de ovulacin. ? Microsoft, como el SOP.  Se puede realizar [place] para lo siguiente: ? Reparar daos en los ovarios,  las trompas de Falopio, el cuello del tero o el tero. ? Extraer tumores del tero. ? Extraer tejido cicatricial del tero, la pelvis u otros rganos femeninos. CUL ES EL TRATAMIENTO PARA LA INFERTILIDAD EN LOS HOMBRES? El tratamiento depende de la causa de la infertilidad. En la International Business Machinesmayora de los casos, la infertilidad en los hombres se trata con medicamentos o [place].  Los hombres pueden tomar medicamentos para: ? Regulatory affairs officerCorregir problemas hormonales. ? Futures traderTratar otras enfermedades. ? Tratar la disfuncin sexual.  Se puede realizar Cipriano Mileuna ciruga para lo siguiente: ? Eliminar obstrucciones en el tracto reproductivo. ? Corregir otros problemas estructurales del tracto reproductivo. QU SON LAS TCNICAS DE REPRODUCCIN ASISTIDA? Las tcnicas de reproduccin asistida (TRA) se refieren a todos los tratamientos y procedimientos que unen vulos y espermatozoides fuera del cuerpo para ayudar a una pareja a Office managerconcebir. Las TRA se suelen Teacher, English as a foreign languagecombinar con medicamentos para la fertilidad que  estimulan la ovulacin. En algunos casos, las TRA se llevan a cabo con vulos extrados del cuerpo de otra mujer (vulos de donante) o con vulos previamente fertilizados y congelados (embriones). Hay diferentes tipos de TRA. Estos incluyen los siguientes:  Inseminacin intrauterina (IIU). ? En este procedimiento, los espermatozoides se colocan directamente en el tero de la mujer con un tubo largo y delgado. ? Esta tcnica puede ser la ms efectiva para la infertilidad causada por problemas con los espermatozoides, incluidos el bajo recuento y la baja motilidad. ? Se puede utilizar en combinacin con medicamentos para la fertilidad.  Fertilizacin in vitro (FIV). ? Por lo general, se [place] Islandsutiliza esta tcnica cuando las trompas de Falopio de la mujer estn obstruidas o si el hombre tiene un bajo recuento de espermatozoides. ? Los medicamentos para la infertilidad estimulan los ovarios para que produzcan varios vulos. Cuando estn maduros, estos vulos se extraen del cuerpo y se unen a los espermatozoides para ser fertilizados. ? Luego los vulos fertilizados se colocan en el tero de la [place]. Esta informacin no tiene Theme park managercomo fin reemplazar el consejo del mdico. Asegrese de hacerle al mdico cualquier pregunta que tenga. Document Released: 05/04/2007 Document Revised: 05/05/2014 Document Reviewed: 12/28/2013 Elsevier Interactive Patient Education  2018 [employer].

## 2018-03-12 ENCOUNTER — Telehealth: Payer: Self-pay

## 2018-03-12 NOTE — Telephone Encounter (Signed)
Called pt to advise her to take PNV. Pt states that she is taking PNV.

## 2018-03-12 NOTE — Telephone Encounter (Signed)
-----   Message from Willodean Rosenthalarolyn Harraway-Smith, MD sent at 03/10/2018  4:52 PM EST ----- Please call pt. Make sure that she is taking PNV.   Thx, clh-S

## 2018-05-27 ENCOUNTER — Encounter: Payer: BLUE CROSS/BLUE SHIELD | Admitting: Family Medicine

## 2018-06-04 ENCOUNTER — Encounter: Payer: Self-pay | Admitting: Family Medicine

## 2018-06-04 ENCOUNTER — Ambulatory Visit (INDEPENDENT_AMBULATORY_CARE_PROVIDER_SITE_OTHER): Payer: PRIVATE HEALTH INSURANCE | Admitting: Family Medicine

## 2018-06-04 VITALS — BP 102/62 | HR 65 | Temp 98.5°F | Ht 66.0 in | Wt 134.0 lb

## 2018-06-04 DIAGNOSIS — Z0000 Encounter for general adult medical examination without abnormal findings: Secondary | ICD-10-CM

## 2018-06-04 DIAGNOSIS — Z23 Encounter for immunization: Secondary | ICD-10-CM | POA: Diagnosis not present

## 2018-06-04 LAB — LIPID PANEL
Cholesterol: 151 mg/dL (ref 0–200)
HDL: 51.6 mg/dL (ref >39.00)
LDL Cholesterol: 84 mg/dL (ref 0–99)
NonHDL: 99.23
Total CHOL/HDL Ratio: 3
Triglycerides: 77 mg/dL (ref 0.0–149.0)
VLDL: 15.4 mg/dL (ref 0.0–40.0)

## 2018-06-04 LAB — COMPREHENSIVE METABOLIC PANEL
ALT: 12 U/L (ref 0–35)
AST: 15 U/L (ref 0–37)
Albumin: 4.3 g/dL (ref 3.5–5.2)
Alkaline Phosphatase: 41 U/L (ref 39–117)
BUN: 9 mg/dL (ref 6–23)
CHLORIDE: 105 meq/L (ref 96–112)
CO2: 26 mEq/L (ref 19–32)
Calcium: 9.3 mg/dL (ref 8.4–10.5)
Creatinine, Ser: 0.76 mg/dL (ref 0.40–1.20)
GFR: 88.39 mL/min (ref >60.00)
Glucose, Bld: 85 mg/dL (ref 70–99)
POTASSIUM: 3.6 meq/L (ref 3.5–5.1)
Sodium: 139 mEq/L (ref 135–145)
Total Bilirubin: 0.4 mg/dL (ref 0.2–1.2)
Total Protein: 6.9 g/dL (ref 6.0–8.3)

## 2018-06-04 LAB — CBC
HEMATOCRIT: 40.8 % (ref 36.0–46.0)
Hemoglobin: 13.6 g/dL (ref 12.0–15.0)
MCHC: 33.4 g/dL (ref 30.0–36.0)
MCV: 95.3 fl (ref 78.0–100.0)
Platelets: 219 10*3/uL (ref 150.0–400.0)
RBC: 4.28 Mil/uL (ref 3.87–5.11)
RDW: 12.6 % (ref 11.5–15.5)
WBC: 6.1 10*3/uL (ref 4.0–10.5)

## 2018-06-04 NOTE — Patient Instructions (Addendum)
Give [place] 2-3 business days to get the results of your labs back.   Keep the diet clean and stay active.  Please consider counseling. Contact [phone] to schedule an appointment or inquire about cost/insurance coverage.  Consider Metamucil (psyllium husk) to help with your bowel movements. Stay well hydrated.   Lactaid may be helpful for minor intolerances to lactose/dairy. If you have severe symptoms, this may be less helpful.   Let us know if you need anything.

## 2018-06-04 NOTE — Progress Notes (Signed)
Chief Complaint  Patient presents with  . Annual Exam     Well Woman Brandi Murray is here for a complete physical.   Her last physical was >1 year ago.  Current diet: in general, a "healthy" diet. Current exercise: cardio and lifting; 4x/week Wt stable? Yes No LMP recorded. Seatbelt? Yes  Health Maintenance Pap/HPV- Yes Tetanus- Yes HIV screening- Yes  Past Medical History:  Diagnosis Date  . No known health problems     Past Surgical History:  Procedure Laterality Date  . NO PAST SURGERIES     Medications  Current Outpatient Medications on File Prior to Visit  Medication Sig Dispense Refill  . Cholecalciferol (VITAMIN D) 2000 units CAPS Take 1 capsule by mouth daily.     Allergies No Known Allergies  Review of Systems: Constitutional:  no unexpected weight changes Eye:  no recent significant change in vision Ear/Nose/Mouth/Throat:  Ears:  no tinnitus or vertigo and no recent change in hearing Nose/Mouth/Throat:  no complaints of nasal congestion, no sore throat Cardiovascular: no chest pain Respiratory:  no cough and no shortness of breath Gastrointestinal:  no abdominal pain, +constipation GU:  Female: negative for dysuria or pelvic pain Musculoskeletal/Extremities:  no pain of the joints Integumentary (Skin/Breast):  no abnormal skin lesions reported Neurologic:  no headaches Endocrine:  denies fatigue Psych: +anxiety  Exam BP 102/62 (BP Location: Left Arm, Patient Position: Sitting, Cuff Size: Normal)   Pulse 65   Temp 98.5 F (36.9 C) (Oral)   Ht 5\' 6"  (1.676 m)   Wt 134 lb (60.8 kg)   SpO2 97%   BMI 21.63 kg/m  General:  well developed, well nourished, in no apparent distress Skin:  no significant moles, warts, or growths Head:  no masses, lesions, or tenderness Eyes:  pupils equal and round, sclera anicteric without injection Ears:  canals without lesions, TMs shiny without retraction, no obvious effusion, no erythema Nose:  nares patent, septum  midline, mucosa normal, and no drainage or sinus tenderness Throat/Pharynx:  lips and gingiva without lesion; tongue and uvula midline; non-inflamed pharynx; no exudates or postnasal drainage Neck: neck supple without adenopathy, thyromegaly, or masses Lungs:  clear to auscultation, breath sounds equal bilaterally, no respiratory distress Cardio:  regular rate and rhythm, no bruits, no LE edema Abdomen:  abdomen soft, nontender; bowel sounds normal; no masses or organomegaly Genital: Defer to GYN Musculoskeletal:  symmetrical muscle groups noted without atrophy or deformity Extremities:  no clubbing, cyanosis, or edema, no deformities, no skin discoloration Neuro:  gait normal; deep tendon reflexes normal and symmetric Psych: well oriented with normal range of affect and appropriate judgment/insight  Assessment and Plan  Well adult exam - Plan: CBC, Comprehensive metabolic panel, Lipid panel  Need for influenza vaccination - Plan: Flu Vaccine QUAD 6+ mos PF IM (Fluarix Quad PF)   Well 33 y.o. female. Counseled on diet and exercise. Mentioned some anxiety. Has been trying to get pregnant, will hold off on meds, counseling info given. For BM's, cont PNV, stay hydrated, trial Metamucil.  Other orders as above. Follow up in 1 yr or prn. The patient voiced understanding and agreement to the plan.  Brandi Roche Eaton, DO 06/04/18 7:59 AM

## 2018-08-26 ENCOUNTER — Encounter: Payer: Self-pay | Admitting: Obstetrics & Gynecology

## 2018-08-26 ENCOUNTER — Other Ambulatory Visit: Payer: Self-pay

## 2018-08-26 ENCOUNTER — Ambulatory Visit (INDEPENDENT_AMBULATORY_CARE_PROVIDER_SITE_OTHER): Payer: PRIVATE HEALTH INSURANCE | Admitting: Obstetrics & Gynecology

## 2018-08-26 VITALS — BP 115/72 | HR 68 | Wt 136.0 lb

## 2018-08-26 DIAGNOSIS — O021 Missed abortion: Secondary | ICD-10-CM | POA: Diagnosis not present

## 2018-08-26 DIAGNOSIS — Z3480 Encounter for supervision of other normal pregnancy, unspecified trimester: Secondary | ICD-10-CM | POA: Insufficient documentation

## 2018-08-26 DIAGNOSIS — Z3400 Encounter for supervision of normal first pregnancy, unspecified trimester: Secondary | ICD-10-CM | POA: Diagnosis not present

## 2018-08-26 MED ORDER — MISOPROSTOL 200 MCG PO TABS: ORAL_TABLET | ORAL | 0 refills | Status: DC

## 2018-08-26 MED ORDER — IBUPROFEN 800 MG PO TABS: 800.0000 mg | ORAL_TABLET | Freq: Three times a day (TID) | ORAL | 3 refills | Status: DC | PRN

## 2018-08-26 NOTE — Progress Notes (Signed)
History:  33 y.o. G1P0000 here today for New OB visit. Pt reports sx of breast tenderness. This is a much planned pregnancy.    The following portions of the patient's history were reviewed and updated as appropriate: allergies, current medications, past family history, past medical history, past social history, past surgical history and problem list.  Review of Systems:  Pertinent items are noted in HPI.    Objective:  Physical Exam Blood pressure 115/72, pulse 68, weight 136 lb (61.7 kg), last menstrual period 06/11/2018.  CONSTITUTIONAL: Well-developed, well-nourished female in no acute distress.  HENT:  Normocephalic, atraumatic EYES: Conjunctivae and EOM are normal. No scleral icterus.  NECK: Normal range of motion SKIN: Skin is warm and dry. No rash noted. Not diaphoretic.No pallor. NEUROLGIC: Alert and oriented to person, place, and time. Normal coordination.   Abd: Soft, nontender and nondistended Pelvic: Normal appearing external genitalia; normal appearing vaginal mucosa and cervix.  Normal discharge.  Small uterus, no other palpable masses, no uterine or adnexal tenderness  Labs and Imaging TVUS- repeated by me. IUP at 8 weeks with no FHR  Assessment & Plan:  Missed AB at 8 weeks. I have discussed with pt the sad outcome and answered her questions. I have then reviewed her management options including observation, surgical management vs cytotec. Pt wishes to take cytotec.   Cytotec ordered to take per vagina and repeat in 8-12 hours if no results.  Motrin 800mg  po q 8 hours prn pain F/u in 2 weeks via WebEx.  Total face-to-face time with patient was 20 min.  Greater than 50% was spent in counseling and coordination of care with the patient.   Zilla Shartzer L. Harraway-Smith, M.D., [place]

## 2018-08-26 NOTE — Progress Notes (Signed)
DATING AND VIABILITY SONOGRAM   Brandi Murray is a 33 y.o. year old G1P0000 with LMP Patient's last menstrual period was 06/11/2018 (exact date). which would correlate to  [redacted]w[redacted]d weeks gestation.  She has regular menstrual cycles.   She is here today for a confirmatory initial sonogram.    GESTATION: SINGLETON     FETAL ACTIVITY:          Heart rate       None visualized          The fetus is inactive.    GESTATIONAL AGE AND  BIOMETRICS:  Gestational criteria: Estimated Date of Delivery: 03/18/19 by LMP now at [redacted]w[redacted]d  Previous Scans:0  GESTATIONAL SAC           2.8 cm        7-6 weeks  CROWN RUMP LENGTH           1.79 cm         8-1 weeks                                                                               AVERAGE EGA(BY THIS SCAN):  8-0 weeks  WORKING EDD( LMP ):  n/a     TECHNICIAN COMMENTS:  Patient informed that the ultrasound is considered a limited obstetric ultrasound and is not intended to be a complete ultrasound exam. Patient also informed that the ultrasound is not being completed with the intent of assessing for fetal or placental anomalies or any pelvic abnormalities. Explained that the purpose of today's ultrasound is to assess for fetal heart rate. Patient acknowledges the purpose of the exam and the limitations of the study.    Brandi Murray 08/26/2018 2:11 PM

## 2018-08-26 NOTE — Patient Instructions (Signed)
Miscarriage A miscarriage is the loss of an unborn baby (fetus) before the 20th week of pregnancy. Most miscarriages happen during the first 3 months of pregnancy. Sometimes, a miscarriage can happen before a woman knows that she is pregnant. Having a miscarriage can be an emotional experience. If you have had a miscarriage, talk with your health care provider about any questions you may have about miscarrying, the grieving process, and your plans for future pregnancy. What are the causes? A miscarriage may be caused by:  Problems with the genes or chromosomes of the fetus. These problems make it impossible for the baby to develop normally. They are often the result of random errors that occur early in the development of the baby, and are not passed from parent to child (not inherited).  Infection of the cervix or uterus.  Conditions that affect hormone balance in the body.  Problems with the cervix, such as the cervix opening and thinning before pregnancy is at term (cervical insufficiency).  Problems with the uterus. These may include: ? A uterus with an abnormal shape. ? Fibroids in the uterus. ? Congenital abnormalities. These are problems that were present at birth.  Certain medical conditions.  Smoking, drinking alcohol, or using drugs.  Injury (trauma). In many cases, the cause of a miscarriage is not known. What are the signs or symptoms? Symptoms of this condition include:  Vaginal bleeding or spotting, with or without cramps or pain.  Pain or cramping in the abdomen or lower back.  Passing fluid, tissue, or blood clots from the vagina. How is this diagnosed? This condition may be diagnosed based on:  A physical exam.  Ultrasound.  Blood tests.  Urine tests. How is this treated? Treatment for a miscarriage is sometimes not necessary if you naturally pass all the tissue that was in your uterus. If necessary, this condition may be treated with:  Dilation and  curettage (D&C). This is a procedure in which the cervix is stretched open and the lining of the uterus (endometrium) is scraped. This is done only if tissue from the fetus or placenta remains in the body (incomplete miscarriage).  Medicines, such as: ? Antibiotic medicine, to treat infection. ? Medicine to help the body pass any remaining tissue. ? Medicine to reduce (contract) the size of the uterus. These medicines may be given if you have a lot of bleeding. If you have Rh negative blood and your baby was Rh positive, you will need a shot of a medicine called Rh immunoglobulinto protect your future babies from Rh blood problems. "Rh-negative" and "Rh-positive" refer to whether or not the blood has a specific protein found on the surface of red blood cells (Rh factor). Follow these instructions at home: Medicines   Take over-the-counter and prescription medicines only as told by your health care provider.  If you were prescribed antibiotic medicine, take it as told by your health care provider. Do not stop taking the antibiotic even if you start to feel better.  Do not take NSAIDs, such as aspirin and ibuprofen, unless they are approved by your health care provider. These medicines can cause bleeding. Activity  Rest as directed. Ask your health care provider what activities are safe for you.  Have someone help with home and family responsibilities during this time. General instructions  Keep track of the number of sanitary pads you use each day and how soaked (saturated) they are. Write down this information.  Monitor the amount of tissue or blood clots that   you pass from your vagina. Save any large amounts of tissue for your health care provider to examine.  Do not use tampons, douche, or have sex until your health care provider approves.  To help you and your partner with the process of grieving, talk with your health care provider or seek counseling.  When you are ready, meet with  your health care provider to discuss any important steps you should take for your health. Also, discuss steps you should take to have a healthy pregnancy in the future.  Keep all follow-up visits as told by your health care provider. This is important. Where to find more information  The American Congress of Obstetricians and Gynecologists: www.acog.org  U.S. Department of Health and CytogeneticistHuman Services Office of Women's Health: http://hoffman.com/www.womenshealth.gov Contact a health care provider if:  You have a fever or chills.  You have a foul smelling vaginal discharge.  You have more bleeding instead of less. Get help right away if:  You have severe cramps or pain in your back or abdomen.  You pass blood clots or tissue from your vagina that is walnut-sized or larger.  You soak more than 1 regular sanitary pad in an hour.  You become light-headed or weak.  You pass out.  You have feelings of sadness that take over your thoughts, or you have thoughts of hurting yourself. Summary  Most miscarriages happen in the first 3 months of pregnancy. Sometimes miscarriage happens before a woman even knows that she is pregnant.  Follow your health care provider's instruction for home care. Keep all follow-up appointments.  To help you and your partner with the process of grieving, talk with your health care provider or seek counseling. This information is not intended to replace advice given to you by your health care provider. Make sure you discuss any questions you have with your health care provider. Document Released: 10/08/2000 Document Revised: 05/20/2016 Document Reviewed: 05/20/2016 Elsevier Interactive Patient Education  2019 Elsevier Inc.    Aborto espontneo Miscarriage El aborto espontneo es la prdida de un beb que no ha nacido (feto) antes de la semana20 del Spencerportembarazo. La mayor parte de los abortos espontneos ocurre durante los primeros 3meses de Psychiatristembarazo. A veces, un aborto ocurre  antes de que la mujer sepa que est Forest Viewembarazada. El aborto espontneo puede ser Neomia Dearuna experiencia que afecte emocionalmente a Dealerla persona. Si ha sufrido un aborto espontneo, hable con su mdico y hgale las preguntas que tenga sobre el aborto espontneo, el proceso de duelo y los planes futuros de [place] Westembarazo. Cules son las causas? Entre las causas de un aborto espontneo se incluyen las siguientes:  Problemas genticos o cromosmicos del feto. Estos problemas impiden que el beb se desarrolle con normalidad. En general, son el resultado de errores fortuitos que ocurren en la etapa temprana del desarrollo y que no se transmiten de padres a hijos (no se heredan).  Infeccin en el cuello del tero.  Trastornos que afectan el equilibrio hormonal del organismo.  Problemas en el cuello del tero, como su adelgazamiento y apertura antes de que el embarazo llegue a trmino (insuficiencia del cuello de tero).  Problemas en el tero. Estos pueden incluir, Bed Bath & Beyondentre otros, los siguientes: ? Forma anormal del tero. ? Fibromas en el tero. ? Anormalidades congnitas. Estos son problemas que ya estaban presentes en el nacimiento.  Ciertas enfermedades crnicas.  Fumar, beber alcohol o usar drogas.  Lesiones (traumatismos). En muchos de Halliburton Companylos casos, se desconoce la causa de los abortos espontneos.  Cules son los signos o los sntomas? Los sntomas de esta afeccin incluyen los siguientes:  Sangrado o manchado vaginal, con o sin clicos o dolor.  Dolor o clicos en el abdomen o en la parte inferior de la espalda.  Eliminacin de lquido, tejidos o cogulos sanguneos por la vagina. Cmo se diagnostica? Esta afeccin se puede diagnosticar en funcin de lo siguiente:  Examen fsico.  Ecografa.  Anlisis de [place].  Anlisis de [place]. Cmo se trata? En algunos casos, el tratamiento de un aborto espontneo no es necesario si se eliminan de forma natural todos los tejidos que se Geneticist, molecular. Si fuera necesario realizar un tratamiento por esta afeccin, este puede incluir lo siguiente:  Dilatacin y curetaje (D&C). Mediante este procedimiento, se expande el cuello del tero y se raspan las paredes (endometrio). Esto se realiza solamente si queda tejido del feto o la placenta dentro del cuerpo (aborto espontneo incompleto).  Medicamentos, por ejemplo: ? Antibiticos para tratar una infeccin. ? Medicamentos para ayudar al cuerpo a eliminar los restos de tejido. ? Medicamentos para reducir Theatre manager) el tamao del tero. Estos medicamentos se pueden administrar si tiene un sangrado abundante. Si su factor sanguneo es Rhnegativo y [place] de su beb es Rhpositivo, usted Equities trader inyeccin del medicamento llamado inmunoglobulinaRhpara proteger a los bebs futuros de Warehouse manager problemas con el factorsanguneoRh. Los trminos "Rhnegativo" y "Rhpositivo" hacen referencia a la presencia o no en la sangre de una protena especfica que se encuentra en la superficie de los glbulos rojos (factor Rh). Siga estas indicaciones en su casa: Medicamentos   [employer] de venta libre y los recetados solamente como se lo haya indicado el mdico.  Si le recetaron antibiticos, tmelos como se lo haya indicado el mdico. No deje de tomar los antibiticos aunque comience a Actor.  No tome antiinflamatorios no esteroideos (AINE), tales como aspirina e ibuprofeno, a menos que se lo indique el mdico. Estos medicamentos pueden provocarle sangrado. Actividad  Haga reposo segn lo indicado. Pregntele al mdico qu actividades son seguras para usted.  Pdale a alguien que la ayude con las responsabilidades familiares y del hogar durante este tiempo. Instrucciones generales  Lleve un registro de la cantidad y la saturacin de las toallas higinicas que Landscape architect. Anote esta informacin.  Anote la cantidad de tejido o cogulos sanguneos que expulsa por la vagina.  Guarde las cantidades grandes de tejidos para que el [employer] examine.  No use tampones, no se haga duchas vaginales ni tenga relaciones sexuales hasta que el mdico la autorice.  Para que usted y su pareja puedan sobrellevar el proceso del duelo, hable con su mdico o busque apoyo psicolgico.  Cuando est lista, visite a su mdico para hablar sobre los pasos importantes que deber seguir en relacin con su salud. Tambin hable [employer] que deber tomar para tener un embarazo saludable en el futuro.  Concurra a todas las visitas de [address] se lo haya indicado el mdico. Esto es importante. Dnde encontrar ms informacin  Colegio Estadounidense de [place] y Insurance claims handler of Obstetricians and Gynecologists): www.acog.org  Departamento de Salud y [address] de los [address], [place] de Salud de Architectural technologist (U.S. Department of Health and [employer], Office on [address]): http://hoffman.com/ [employer] con un mdico si:  Tiene fiebre o siente escalofros.  Tiene una secrecin vaginal con mal olor.  El sangrado aumenta en vez de disminuir. Solicite ayuda de inmediato si:  [employer]  calambres intensos o dolor en la espalda o en el abdomen.  Elimina cogulos de sangre o tejido por la vagina del tamao de una nuez o ms grandes.  Necesita ms de una toalla higinica de tamao regular por hora.  Se siente mareada o dbil.  Se desmaya.  Siente una tristeza que la invade o Archivist. Resumen  La mayor parte de los abortos espontneos ocurre durante los primeros de Psychiatrist. En algunos casos, el aborto espontneo ocurre antes de que la mujer sepa que est [place].  Siga las indicaciones del mdico para el cuidado Facilities manager. Concurra a todas las visitas de control.  Para que usted y su pareja puedan sobrellevar el proceso del duelo, hable con su mdico o busque apoyo psicolgico. Esta informacin no tiene Public house manager el consejo del mdico. Asegrese de hacerle al mdico cualquier pregunta que tenga. Document Released: 01/22/2005 Document Revised: 01/19/2017 Document Reviewed: 01/19/2017 Elsevier Interactive Patient Education  2019 [employer].

## 2018-09-02 ENCOUNTER — Telehealth: Payer: Self-pay

## 2018-09-02 NOTE — Telephone Encounter (Signed)
Patient states that she is bleeding and has had some cramping since then.  Patient states she inserted the cytotec on Friday and Saturday morning around 840 she noticed she passed a lot of tissue and then had another episode about two later where she passed moe tissue.   Patient advised she can take ibprofen for pain. Patient states understanding and states she took on 200mg  yesterday for the cramping and it helped.   Patient plans to follow up on may 14th. Armandina Stammer RN

## 2018-09-09 ENCOUNTER — Ambulatory Visit (INDEPENDENT_AMBULATORY_CARE_PROVIDER_SITE_OTHER): Payer: PRIVATE HEALTH INSURANCE | Admitting: Obstetrics & Gynecology

## 2018-09-09 DIAGNOSIS — O021 Missed abortion: Secondary | ICD-10-CM

## 2018-09-09 NOTE — Progress Notes (Signed)
Patient identified by name and dob. Patient agrees to webex visit.  Patient has follow up visit for MAB. Armandina Stammer RN

## 2018-09-09 NOTE — Progress Notes (Signed)
   [facility] GYNECOLOGY VISIT ENCOUNTER NOTE  I connected with Brandi Murray on 09/09/18 at  1:45 PM EDT by WebEx at home and verified that I am speaking with the correct person using two identifiers.   I discussed the limitations, risks, security and privacy concerns of performing an evaluation and management service by telephone and the availability of in person appointments. I also discussed with the patient that there may be a patient responsible charge related to this service. The patient expressed understanding and agreed to proceed.   History:  Brandi Murray is a 33 y.o. G81P0000 female being evaluated today for MAB. Pt took Cytotec at home 2 weeks prev and passed the POC the next day.She had spotting the next week but now denies abnormal vaginal discharge, bleeding, pelvic pain or other concerns.  Pt reports that her mood is good at present and she has felt better emotionally than she expected. She has a good support system.     Past Medical History:  Diagnosis Date  . No known health problems    Past Surgical History:  Procedure Laterality Date  . NO PAST SURGERIES    . WISDOM TOOTH EXTRACTION     The following portions of the patient's history were reviewed and updated as appropriate: allergies, current medications, past family history, past medical history, past social history, past surgical history and problem list.    Review of Systems:  Pertinent items noted in HPI and remainder of comprehensive ROS otherwise negative.  Physical Exam:   General:  Alert, oriented and cooperative. Patient appears to be in no acute distress.  Mental Status: Normal mood and affect. Normal behavior. Normal judgment and thought content.   Respiratory: Normal respiratory effort, no problems with respiration noted  Rest of physical exam deferred due to type of encounter    Assessment and Plan:     Missed ab  Reviewed post loss care  Pt will cont PNV  F/u in 4 months for annual or  sooner prn      I discussed the assessment and treatment plan with the patient. The patient was provided an opportunity to ask questions and all were answered. The patient agreed with the plan and demonstrated an understanding of the instructions.   The patient was advised to call back or seek an in-person evaluation/go to the ED if the symptoms worsen or if the condition fails to improve as anticipated.  I provided 15 minutes of face-to-face via WebEx time during this encounter.   Brandi Rosenthal, MD [facility] [employer], [facility]

## 2018-09-10 ENCOUNTER — Encounter: Payer: Self-pay | Admitting: Obstetrics & Gynecology

## 2019-01-19 ENCOUNTER — Ambulatory Visit (INDEPENDENT_AMBULATORY_CARE_PROVIDER_SITE_OTHER): Payer: PRIVATE HEALTH INSURANCE | Admitting: Obstetrics & Gynecology

## 2019-01-19 ENCOUNTER — Other Ambulatory Visit: Payer: Self-pay

## 2019-01-19 VITALS — BP 96/61 | HR 65 | Ht 66.0 in | Wt 135.0 lb

## 2019-01-19 DIAGNOSIS — Z01419 Encounter for gynecological examination (general) (routine) without abnormal findings: Secondary | ICD-10-CM

## 2019-01-19 DIAGNOSIS — N970 Female infertility associated with anovulation: Secondary | ICD-10-CM | POA: Diagnosis not present

## 2019-01-19 MED ORDER — LETROZOLE 2.5 MG PO TABS: 2.5000 mg | ORAL_TABLET | Freq: Every day | ORAL | 2 refills | Status: DC

## 2019-01-19 NOTE — Patient Instructions (Signed)
Letrozole tablets What is this medicine? LETROZOLE (LET roe zole) blocks the production of estrogen. It is used to treat breast cancer. This medicine may be used for other purposes; ask your health care provider or pharmacist if you have questions. COMMON BRAND NAME(S): Femara What should I tell my health care provider before I take this medicine? They need to know if you have any of these conditions:  high cholesterol  liver disease  osteoporosis (weak bones)  an unusual or allergic reaction to letrozole, other medicines, foods, dyes, or preservatives  pregnant or trying to get pregnant  breast-feeding How should I use this medicine? Take this medicine by mouth with a glass of water. You may take it with or without food. Follow the directions on the prescription label. Take your medicine at regular intervals. Do not take your medicine more often than directed. Do not stop taking except on your doctor's advice. Talk to your pediatrician regarding the use of this medicine in children. Special care may be needed. Overdosage: If you think you have taken too much of this medicine contact a poison control center or emergency room at once. NOTE: This medicine is only for you. Do not share this medicine with others. What if I miss a dose? If you miss a dose, take it as soon as you can. If it is almost time for your next dose, take only that dose. Do not take double or extra doses. What may interact with this medicine? Do not take this medicine with any of the following medications:  estrogens, like hormone replacement therapy or birth control pills This medicine may also interact with the following medications:  dietary supplements such as androstenedione or DHEA  prasterone  tamoxifen This list may not describe all possible interactions. Give your health care provider a list of all the medicines, herbs, non-prescription drugs, or dietary supplements you use. Also tell them if you  smoke, drink alcohol, or use illegal drugs. Some items may interact with your medicine. What should I watch for while using this medicine? Tell your doctor or healthcare professional if your symptoms do not start to get better or if they get worse. Do not become pregnant while taking this medicine or for 3 weeks after stopping it. Women should inform their doctor if they wish to become pregnant or think they might be pregnant. There is a potential for serious side effects to an unborn child. Talk to your health care professional or pharmacist for more information. Do not breast-feed while taking this medicine or for 3 weeks after stopping it. This medicine may interfere with the ability to have a child. Talk with your doctor or health care professional if you are concerned about your fertility. Using this medicine for a long time may increase your risk of low bone mass. Talk to your doctor about bone health. You may get drowsy or dizzy. Do not drive, use machinery, or do anything that needs mental alertness until you know how this medicine affects you. Do not stand or sit up quickly, especially if you are an older patient. This reduces the risk of dizzy or fainting spells. You may need blood work done while you are taking this medicine. What side effects may I notice from receiving this medicine? Side effects that you should report to your doctor or health care professional as soon as possible:  allergic reactions like skin rash, itching, or hives  bone fracture  chest pain  signs and symptoms of a blood clot   such as breathing problems; changes in vision; chest pain; severe, sudden headache; pain, swelling, warmth in the leg; trouble speaking; sudden numbness or weakness of the face, arm or leg  vaginal bleeding Side effects that usually do not require medical attention (report to your doctor or health care professional if they continue or are bothersome):  bone, back, joint, or muscle  pain  dizziness  fatigue  fluid retention  headache  hot flashes, night sweats  nausea  weight gain This list may not describe all possible side effects. Call your doctor for medical advice about side effects. You may report side effects to FDA at 1-800-FDA-1088. Where should I keep my medicine? Keep out of the reach of children. Store between 15 and 30 degrees C (59 and 86 degrees F). Throw away any unused medicine after the expiration date. NOTE: This sheet is a summary. It may not cover all possible information. If you have questions about this medicine, talk to your doctor, pharmacist, or health care provider.  2020 Elsevier/Gold Standard (2015-11-19 11:10:41)  

## 2019-01-19 NOTE — Progress Notes (Signed)
Subjective:     Brandi Murray is a 33 y.o. female here for a routine exam. G1P0 Current complaints: pt reports that her LMP was Sept 11. She reports that since the SAB she's had pressure with her cycles. Pt has been sexually active without contraception for 4 years with only one conception in 07/2018. She is interested in starling meds for ovulation induction.      Gynecologic History Patient's last menstrual period was 01/07/2019 (exact date). Contraception: none Last Pap: 01/25/2018. Results were: normal Last mammogram: n/a.   Obstetric History OB History  Gravida Para Term Preterm AB Living  1 0 0 0 0 0  SAB TAB Ectopic Multiple Live Births  0 0 0 0 0    # Outcome Date GA Lbr Len/2nd Weight Sex Delivery Anes PTL Lv  1 Gravida             The following portions of the patient's history were reviewed and updated as appropriate: allergies, current medications, past family history, past medical history, past social history, past surgical history and problem list.  Review of Systems Pertinent items are noted in HPI.    Objective:   BP 96/61   Pulse 65   Ht 5\' 6"  (1.676 m)   Wt 135 lb (61.2 kg)   LMP 01/07/2019 (Exact Date)   BMI 21.79 kg/m  General Appearance:    Alert, cooperative, no distress, appears stated age  Head:    Normocephalic, without obvious abnormality, atraumatic  Eyes:    conjunctiva/corneas clear, EOM's intact, both eyes  Ears:    Normal external ear canals, both ears  Nose:   Nares normal, septum midline, mucosa normal, no drainage    or sinus tenderness  Throat:   Lips, mucosa, and tongue normal; teeth and gums normal  Neck:   Supple, symmetrical, trachea midline, no adenopathy;    thyroid:  no enlargement/tenderness/nodules  Back:     Symmetric, no curvature, ROM normal, no CVA tenderness  Lungs:     respirations unlabored  Chest Wall:    No tenderness or deformity   Heart:    Regular rate and rhythm  Breast Exam:    No tenderness, masses, or nipple  abnormality  Abdomen:     Soft, non-tender, bowel sounds active all four quadrants,    no masses, no organomegaly  Genitalia:    Normal female without lesion, discharge or tenderness     Extremities:   Extremities normal, atraumatic, no cyanosis or edema  Pulses:   2+ and symmetric all extremities  Skin:   Skin color, texture, turgor normal, no rashes or lesions    Assessment:    Healthy female exam.   Pt with limited fertility. Normal semen analysis prev. Reviewed the risks assoc with ovulation induction including increase rates of multiples. Pt wishes to proceed.    Plan:    Follow up in: 3 month.    Letrozole 2.5mg  1 po on days 5-9 of her menstrual cycle.  Reviewed use and se Pt to restart PNV.  All questions answered.   [name] L. Harraway-Smith, M.D., Cherlynn June

## 2019-04-25 ENCOUNTER — Telehealth: Payer: Self-pay

## 2019-04-25 NOTE — Telephone Encounter (Signed)
Pt called the office she is scheduled for infertility f/u appt on 05/01/18. Pt states she is pregnant and wants to know should she keep the f/u appt. Pt is scheduled for NOB appt on 05/22/18. Brandi Murray l Debrah Granderson, CMA

## 2019-04-26 ENCOUNTER — Other Ambulatory Visit: Payer: Self-pay

## 2019-04-26 DIAGNOSIS — O3680X0 Pregnancy with inconclusive fetal viability, not applicable or unspecified: Secondary | ICD-10-CM

## 2019-04-27 ENCOUNTER — Ambulatory Visit (HOSPITAL_BASED_OUTPATIENT_CLINIC_OR_DEPARTMENT_OTHER)
Admission: RE | Admit: 2019-04-27 | Discharge: 2019-04-27 | Disposition: A | Discharge status: Home / Self Care | Payer: No Typology Code available for payment source | Source: Ambulatory Visit | Attending: Obstetrics & Gynecology | Admitting: Obstetrics & Gynecology

## 2019-04-27 ENCOUNTER — Telehealth: Payer: Self-pay | Admitting: Obstetrics & Gynecology

## 2019-04-27 ENCOUNTER — Telehealth: Payer: Self-pay | Admitting: Obstetrics and Gynecology

## 2019-04-27 ENCOUNTER — Other Ambulatory Visit: Payer: No Typology Code available for payment source

## 2019-04-27 ENCOUNTER — Other Ambulatory Visit: Payer: Self-pay

## 2019-04-27 DIAGNOSIS — O3680X0 Pregnancy with inconclusive fetal viability, not applicable or unspecified: Secondary | ICD-10-CM | POA: Insufficient documentation

## 2019-04-27 DIAGNOSIS — O209 Hemorrhage in early pregnancy, unspecified: Secondary | ICD-10-CM

## 2019-04-27 NOTE — Progress Notes (Signed)
Patient sent to lab for blood draw. Josilyn Shippee RN 

## 2019-04-27 NOTE — Telephone Encounter (Signed)
Patient sent to lab for blood draw. Jennifer Howard RN 

## 2019-04-27 NOTE — Telephone Encounter (Signed)
Call from [facility] ultrasound. Pt referred for first tri u/s. U/s today read no signs iup, normal adnexa. I spoke w/ the patient. Used letrozole for ovulation induction about one month ago. A home pregnancy test resulted positive several days ago. She has had very mild spotting for 5 days. No pain, no fever. I discussed the ddx: erroneous home pregnancy test, early pregnancy, SAB, early non-visualized ectopic. Pt has f/u scheduled 1/25. I advised monitoring symptoms: for severe bleeding, pain, or infectious symptoms, would need prompt MAU/ED f/u. Otherwise f/u 1/25. I don't see evidence of Rh status in chart but as if pregnant pregnancy is well less than 7 wks, declined to advise checking that.

## 2019-04-28 ENCOUNTER — Telehealth: Payer: Self-pay | Admitting: Obstetrics & Gynecology

## 2019-04-28 DIAGNOSIS — O209 Hemorrhage in early pregnancy, unspecified: Secondary | ICD-10-CM

## 2019-04-28 LAB — BETA HCG QUANT (REF LAB): hCG Quant: 676 m[IU]/mL

## 2019-04-28 NOTE — Telephone Encounter (Signed)
TC to pt. She has still been having bleeding it is light and not assoc with pain. I reviewed the results. I suspect a incomplete AB but, warned that this could still be an ectopic. Precautions were given. All questions answered.   Pt to f/u in 4 days for repeat qHCG.   Brandi Murray, M.D., Cherlynn June

## 2019-05-02 ENCOUNTER — Other Ambulatory Visit: Payer: No Typology Code available for payment source

## 2019-05-02 ENCOUNTER — Other Ambulatory Visit: Payer: Self-pay

## 2019-05-02 ENCOUNTER — Ambulatory Visit: Payer: PRIVATE HEALTH INSURANCE | Admitting: Obstetrics & Gynecology

## 2019-05-02 DIAGNOSIS — O209 Hemorrhage in early pregnancy, unspecified: Secondary | ICD-10-CM

## 2019-05-02 NOTE — Progress Notes (Signed)
Patient sent to lab for bhcg. Armandina Stammer RN

## 2019-05-03 ENCOUNTER — Telehealth: Payer: Self-pay

## 2019-05-03 LAB — BETA HCG QUANT (REF LAB): hCG Quant: 73 m[IU]/mL

## 2019-05-03 NOTE — Telephone Encounter (Signed)
-----   Message from Willodean Rosenthal, MD sent at 05/03/2019  3:24 PM EST ----- Please call pt. Her  qHCG has appropriately fallen and she is no longer pregnant.   No further follow up is needed.   She can restart the Letrozole in 1 month.   Clh-S

## 2019-05-03 NOTE — Telephone Encounter (Signed)
Patient called and made aware of appropiate fallen HCG. Per Dr. Erin Fulling she should wait one month before starting the Femara again. Patient states understanding . Armandina Stammer, RN

## 2019-05-23 ENCOUNTER — Ambulatory Visit: Payer: No Typology Code available for payment source | Admitting: Obstetrics & Gynecology

## 2019-06-08 ENCOUNTER — Other Ambulatory Visit: Payer: Self-pay

## 2019-06-08 ENCOUNTER — Encounter: Payer: Self-pay | Admitting: Obstetrics & Gynecology

## 2019-06-08 ENCOUNTER — Ambulatory Visit (INDEPENDENT_AMBULATORY_CARE_PROVIDER_SITE_OTHER): Payer: No Typology Code available for payment source | Admitting: Obstetrics & Gynecology

## 2019-06-08 VITALS — BP 119/63 | HR 69 | Resp 16 | Ht 66.0 in | Wt 142.0 lb

## 2019-06-08 DIAGNOSIS — O039 Complete or unspecified spontaneous abortion without complication: Secondary | ICD-10-CM

## 2019-06-08 DIAGNOSIS — Z01419 Encounter for gynecological examination (general) (routine) without abnormal findings: Secondary | ICD-10-CM

## 2019-06-08 DIAGNOSIS — N979 Female infertility, unspecified: Secondary | ICD-10-CM | POA: Diagnosis not present

## 2019-06-08 DIAGNOSIS — N970 Female infertility associated with anovulation: Secondary | ICD-10-CM

## 2019-06-08 DIAGNOSIS — F064 Anxiety disorder due to known physiological condition: Secondary | ICD-10-CM

## 2019-06-08 MED ORDER — LETROZOLE 2.5 MG PO TABS: 2.5000 mg | ORAL_TABLET | Freq: Every day | ORAL | 2 refills | Status: DC

## 2019-06-08 MED ORDER — BUSPIRONE HCL 5 MG PO TABS: 5.0000 mg | ORAL_TABLET | Freq: Two times a day (BID) | ORAL | 2 refills | Status: DC

## 2019-06-08 NOTE — Progress Notes (Signed)
History:  34 y.o. G1P0010 here today for f/u of SAB. Pt reports that she is doing well. She denies further bleeding.  She reports that she is doing well emotionally but, feel anxious. She reports that she was experiencing this prior to the SAB.      The following portions of the patient's history were reviewed and updated as appropriate: allergies, current medications, past family history, past medical history, past social history, past surgical history and problem list.  Review of Systems:  Pertinent items are noted in HPI.    Objective:  Physical Exam Blood pressure 119/63, pulse 69, resp. rate 16, height 5\' 6"  (1.676 m), weight 142 lb (64.4 kg), last menstrual period 05/28/2019, not currently breastfeeding.  CONSTITUTIONAL: Well-developed, well-nourished female in no acute distress.  HENT:  Normocephalic, atraumatic EYES: Conjunctivae and EOM are normal. No scleral icterus.  NECK: Normal range of motion SKIN: Skin is warm and dry. No rash noted. Not diaphoretic.No pallor. NEUROLGIC: Alert and oriented to person, place, and time. Normal coordination.  Abd: Soft, nontender and nondistended Pelvic: deferred   Assessment & Plan:  Brandi Murray was seen today for follow-up.  Diagnoses and all orders for this visit:  Anxiety disorder due to known physiological condition  SAB (spontaneous abortion)  Primary female infertility  Well female exam with routine gynecological exam -     letrozole (FEMARA) 2.5 MG tablet; Take 1 tablet (2.5 mg total) by mouth daily. Take on days 5-9 following a spontaneous menses or progestin-induced bleed.  Infertility due to oligo-ovulation -     busPIRone (BUSPAR) 5 MG tablet; Take 1 tablet (5 mg total) by mouth 2 (two) times daily. -     letrozole (FEMARA) 2.5 MG tablet; Take 1 tablet (2.5 mg total) by mouth daily. Take on days 5-9 following a spontaneous menses or progestin-induced bleed.  Total face-to-face time with patient was 15 min.  Greater than 50%  was spent in counseling and coordination of care with the patient.   Brandi Murray L. Harraway-Smith, M.D., Desma Maxim

## 2019-06-14 ENCOUNTER — Encounter: Payer: Self-pay | Admitting: Obstetrics & Gynecology

## 2019-07-04 ENCOUNTER — Other Ambulatory Visit: Payer: Self-pay

## 2019-07-04 DIAGNOSIS — N970 Female infertility associated with anovulation: Secondary | ICD-10-CM

## 2019-07-04 MED ORDER — BUSPIRONE HCL 5 MG PO TABS: 5.0000 mg | ORAL_TABLET | Freq: Two times a day (BID) | ORAL | 2 refills | Status: DC

## 2019-07-19 ENCOUNTER — Ambulatory Visit: Payer: PRIVATE HEALTH INSURANCE | Admitting: Family Medicine

## 2019-08-01 ENCOUNTER — Other Ambulatory Visit: Payer: Self-pay

## 2019-08-02 ENCOUNTER — Encounter: Payer: Self-pay | Admitting: Family Medicine

## 2019-08-02 ENCOUNTER — Other Ambulatory Visit: Payer: Self-pay

## 2019-08-02 ENCOUNTER — Ambulatory Visit (INDEPENDENT_AMBULATORY_CARE_PROVIDER_SITE_OTHER): Payer: No Typology Code available for payment source | Admitting: Family Medicine

## 2019-08-02 VITALS — BP 102/68 | HR 78 | Temp 95.4°F | Ht 66.0 in | Wt 142.2 lb

## 2019-08-02 DIAGNOSIS — Z0000 Encounter for general adult medical examination without abnormal findings: Secondary | ICD-10-CM | POA: Diagnosis not present

## 2019-08-02 LAB — COMPREHENSIVE METABOLIC PANEL
ALT: 18 U/L (ref 0–35)
AST: 19 U/L (ref 0–37)
Albumin: 4.2 g/dL (ref 3.5–5.2)
Alkaline Phosphatase: 44 U/L (ref 39–117)
BUN: 11 mg/dL (ref 6–23)
CO2: 29 mEq/L (ref 19–32)
Calcium: 9.5 mg/dL (ref 8.4–10.5)
Chloride: 103 mEq/L (ref 96–112)
Creatinine, Ser: 0.7 mg/dL (ref 0.40–1.20)
GFR: 96.48 mL/min (ref >60.00)
Glucose, Bld: 79 mg/dL (ref 70–99)
Potassium: 4.2 mEq/L (ref 3.5–5.1)
Sodium: 138 mEq/L (ref 135–145)
Total Bilirubin: 0.5 mg/dL (ref 0.2–1.2)
Total Protein: 6.9 g/dL (ref 6.0–8.3)

## 2019-08-02 LAB — TSH: TSH: 1.24 u[IU]/mL (ref 0.35–4.50)

## 2019-08-02 LAB — LIPID PANEL
Cholesterol: 147 mg/dL (ref 0–200)
HDL: 55 mg/dL (ref >39.00)
LDL Cholesterol: 80 mg/dL (ref 0–99)
NonHDL: 91.68
Total CHOL/HDL Ratio: 3
Triglycerides: 58 mg/dL (ref 0.0–149.0)
VLDL: 11.6 mg/dL (ref 0.0–40.0)

## 2019-08-02 LAB — LUTEINIZING HORMONE: LH: 29.49 m[IU]/mL

## 2019-08-02 LAB — FOLLICLE STIMULATING HORMONE: FSH: 15.1 m[IU]/mL

## 2019-08-02 NOTE — Patient Instructions (Addendum)
Give us 2-3 business days to get the results of your labs back.   Keep the diet clean and stay active.  Let us know if you need anything.  Semimembranosus Tendinitis Rehab  It is normal to feel mild stretching, pulling, tightness, or discomfort as you do these exercises, but you should stop right away if you feel sudden pain or your pain gets worse.  Stretching and range of motion exercises These exercises warm up your muscles and joints and improve the movement and flexibility of your thigh. These exercises also help to relieve pain, numbness, and tingling. Exercise A: Hamstring stretch, supine    1. Lie on your back. Loop a belt or towel across the ball of your left / right foot The ball of your foot is on the walking surface, right under your toes. 2. Straighten your left / right knee and slowly pull on the belt to raise your leg. Stop when you feel a gentle stretch behind your left / right knee or thigh. ? Do not allow the knee to bend. ? Keep your other leg flat on the floor. 3. Hold this position for 30 seconds. Repeat 2 times. Complete this exercise 3 times a week. Strengthening exercises These exercises build strength and endurance in your thigh. Endurance is the ability to use your muscles for a long time, even after they get tired. Exercise B: Straight leg raises (hip extensors) 1. Lie on your belly on a bed or a firm surface with a pillow under your hips. 2. Bend your left / right knee so your foot is straight up in the air. 3. Squeeze your buttock muscles and lift your left / right thigh off the bed. Do not let your back arch. 4. Hold this position for 3 seconds. 5. Slowly return to the starting position. Let your muscles relax completely before you do another repetition. Repeat 2 times. Complete this exercise 3 times a week. Exercise C: Bridge (hip extensors)     1. Lie on your back on a firm surface with your knees bent and your feet flat on the floor. 2. Tighten your  buttocks muscles and lift your bottom off the floor until your trunk is level with your thighs. ? You should feel the muscles working in your buttocks and the back of your thighs. If you do not feel these muscles, slide your feet 1-2 inches (2.5-5 cm) farther away from your buttocks. ? Do not arch your back. 3. Hold this position for 3 seconds. 4. Slowly lower your hips to the starting position. 5. Let your buttocks muscles relax completely between repetitions. If this exercise is too easy, try doing it with your arms crossed over your chest. Repeat 2 times. Complete this exercise 3 times a week. Exercise D: Hamstring eccentric, prone 1. Lie on your belly on a bed or on the floor. 2. Start with your legs straight. Cross your legs at the ankles with your left / right leg on top. 3. Using your bottom leg to do the work, bend both knees. 4. Using just your left / right leg alone, slowly lower your leg back down toward the bed. Add a 5 lb weight as told by your health care provider. 5. Let your muscles relax completely between repetitions. Repeat 2 times. Complete this exercise 3 times a week. Exercise E: Squats 1. Stand in front of a table, with your feet and knees pointing straight ahead. You may rest your hands on the table for balance but   not for support. 2. Slowly bend your knees and lower your hips like you are going to sit in a chair. Keep your thighs straight or pointed slightly outward. ? Keep your weight over your heels, not over your toes. ? Keep your lower legs upright so they are parallel with the table legs. ? Do not let your hips go lower than your knees. Stop when your knees are bent to the shape of an upside-down letter "L" (90 degree angle). ? Do not bend lower than told by your health care provider. ? If your knee pain increases, do not bend as low. 3. Hold the squat position 1-2 seconds. 4. Slowly push with your legs to return to standing. Do not use your hands to pull  yourself to standing. Repeat 2 times. Complete this exercise 3 times a week. Make sure you discuss any questions you have with your health care provider. Document Released: 04/14/2005 Document Revised: 12/20/2015 Document Reviewed: 01/16/2015 Elsevier Interactive Patient Education  2018 Elsevier Inc.  

## 2019-08-02 NOTE — Progress Notes (Signed)
Chief Complaint  Patient presents with  . Annual Exam     Well Woman Brandi Murray is here for a complete physical.   Her last physical was >1 year ago.  Current diet: in general, a "healthy" diet. Current exercise: some walking. Contraception? No, trying to conceive Has gained some weight.  Seatbelt? Yes  Health Maintenance Pap/HPV- Yes Tetanus- Yes HIV screening- Yes  Past Medical History:  Diagnosis Date  . No known health problems      Past Surgical History:  Procedure Laterality Date  . NO PAST SURGERIES    . WISDOM TOOTH EXTRACTION      Medications  Current Outpatient Medications on File Prior to Visit  Medication Sig Dispense Refill  . busPIRone (BUSPAR) 5 MG tablet Take 1 tablet (5 mg total) by mouth 2 (two) times daily. 60 tablet 2  . Cholecalciferol (VITAMIN D) 2000 units CAPS Take 1 capsule by mouth daily.    Marland Kitchen letrozole (FEMARA) 2.5 MG tablet Take 1 tablet (2.5 mg total) by mouth daily. Take on days 5-9 following a spontaneous menses or progestin-induced bleed. 15 tablet 2    Allergies No Known Allergies  Review of Systems: Constitutional:  no unexpected weight changes Eye:  no recent significant change in vision Ear/Nose/Mouth/Throat:  Ears:  no tinnitus or vertigo and no recent change in hearing Nose/Mouth/Throat:  no complaints of nasal congestion, no sore throat Cardiovascular: no chest pain Respiratory:  no cough and no shortness of breath Gastrointestinal:  no abdominal pain, no change in bowel habits GU:  Female: negative for dysuria or pelvic pain Musculoskeletal/Extremities: +L knee pain on longer walks; otherwise no pain of the joints Integumentary (Skin/Breast):  no abnormal skin lesions reported Neurologic:  no headaches Endocrine:  denies fatigue Psych: +anxiety  Exam BP 102/68 (BP Location: Left Arm, Patient Position: Sitting, Cuff Size: Normal)   Pulse 78   Temp (!) 95.4 F (35.2 C) (Temporal)   Ht 5\' 6"  (1.676 m)   Wt 142 lb 4  oz (64.5 kg)   SpO2 98%   BMI 22.96 kg/m  General:  well developed, well nourished, in no apparent distress Skin:  no significant moles, warts, or growths Head:  no masses, lesions, or tenderness Eyes:  pupils equal and round, sclera anicteric without injection Ears:  canals without lesions, TMs shiny without retraction, no obvious effusion, no erythema Nose:  nares patent, septum midline, mucosa normal, and no drainage or sinus tenderness Throat/Pharynx:  lips and gingiva without lesion; tongue and uvula midline; non-inflamed pharynx; no exudates or postnasal drainage Neck: neck supple without adenopathy, thyromegaly, or masses Lungs:  clear to auscultation, breath sounds equal bilaterally, no respiratory distress Cardio:  regular rate and rhythm, no bruits, no LE edema Abdomen:  abdomen soft, nontender; bowel sounds normal; no masses or organomegaly Genital: Defer to GYN Musculoskeletal: +mild ttp over distal L hamstring; otherwise symmetrical muscle groups noted without atrophy or deformity Extremities:  no clubbing, cyanosis, or edema, no deformities, no skin discoloration Neuro:  gait normal; deep tendon reflexes normal and symmetric Psych: well oriented with normal range of affect and appropriate judgment/insight  Assessment and Plan  Well adult exam - Plan: TSH, LH, FSH, CBC, Comprehensive metabolic panel, Lipid panel   Well 34 y.o. female. Counseled on diet and exercise. Other orders as above. Stretches/exercises for hamstring issue should it arise again.  Follow up in 1 yr or prn. The patient voiced understanding and agreement to the plan.  [address] [place], DO 08/02/19  11:04 AM

## 2019-09-12 IMAGING — RF DG HYSTEROGRAM
6 series · 12 of 12 positions shown · IV contrast (omnipaque)
Comparison: None.

CLINICAL DATA: Primary infertility.

EXAM:
HYSTEROSALPINGOGRAM
TECHNIQUE: Following cleansing of the cervix and vagina with Betadine solution,
a hysterosalpingogram was performed using a 5-French
hysterosalpingogram catheter and Omnipaque 300 contrast. The patient
tolerated the examination without difficulty.

[Series 1: run · 2 of 2 slices shown (1 of 6)]
[im 1/2]
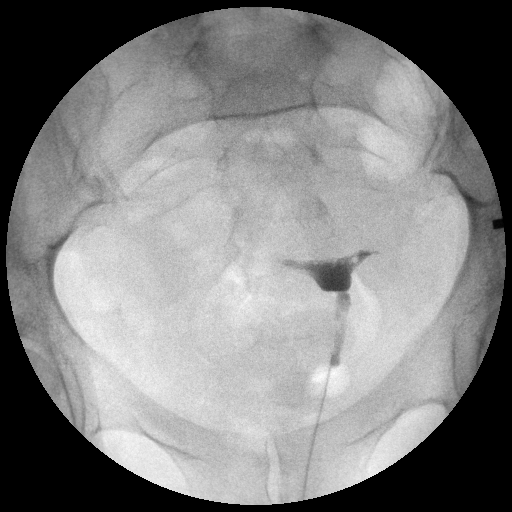
[im 2/2]
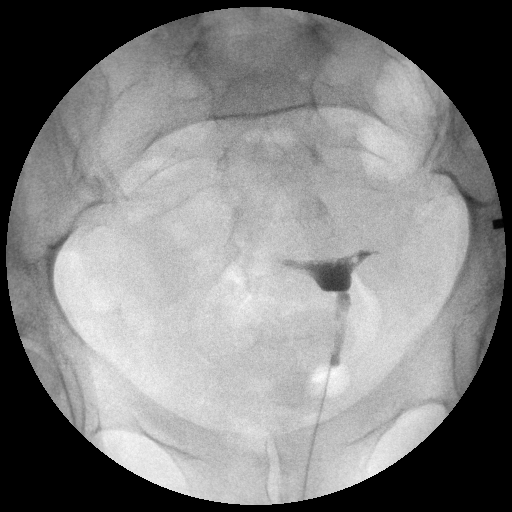

[Series 2: run · 2 of 2 slices shown (2 of 6)]
[im 1/2]
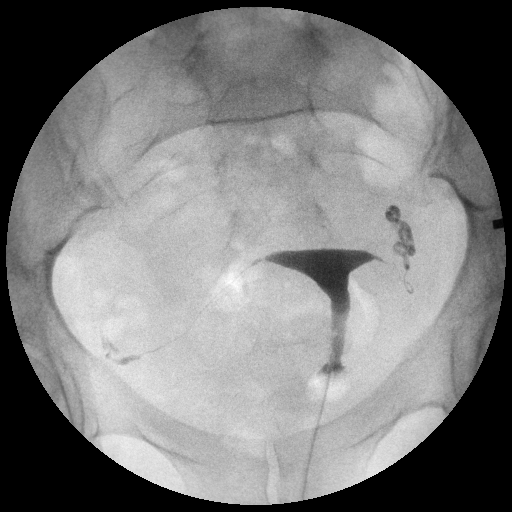
[im 2/2]
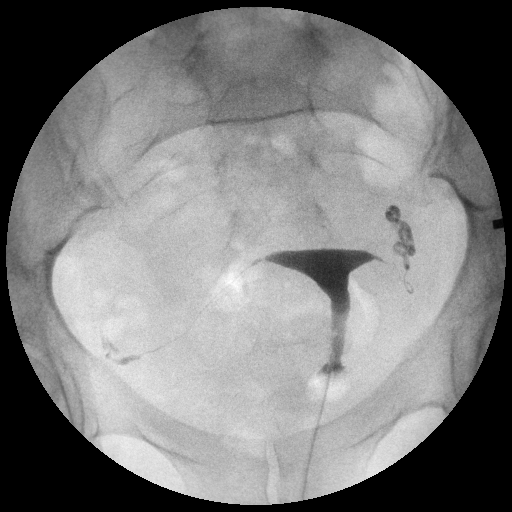

[Series 3: run · 2 of 2 slices shown (3 of 6)]
[im 1/2]
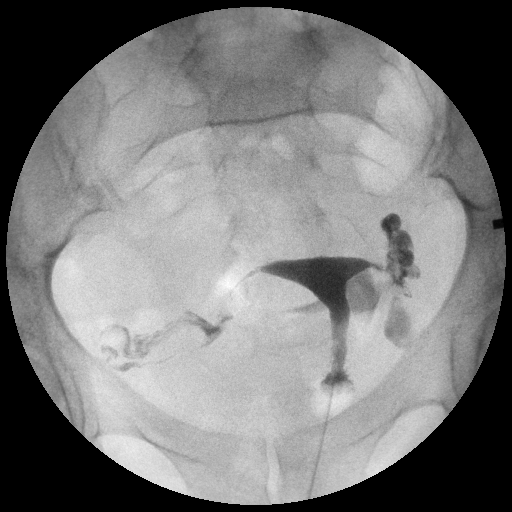
[im 2/2]
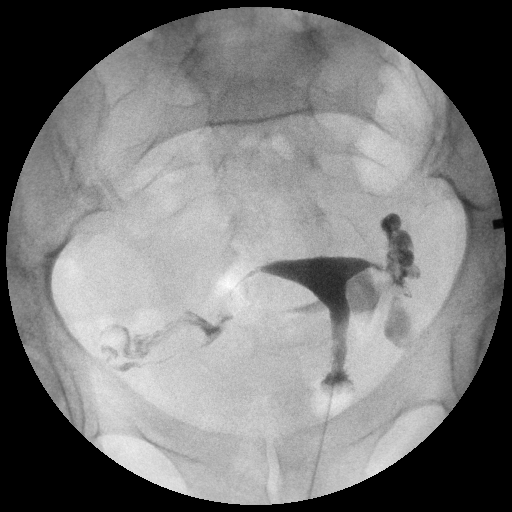

[Series 4: run · 2 of 2 slices shown (4 of 6)]
[im 1/2]
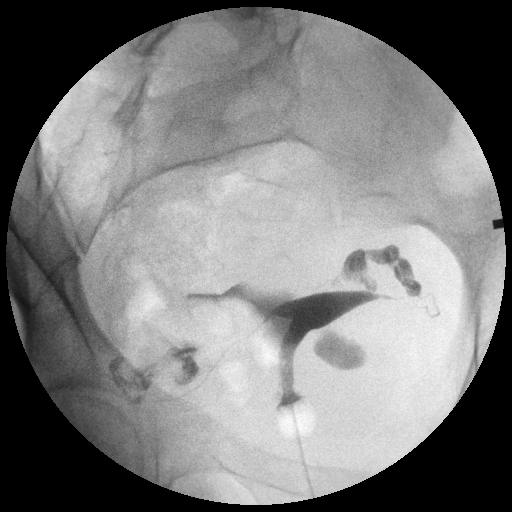
[im 2/2]
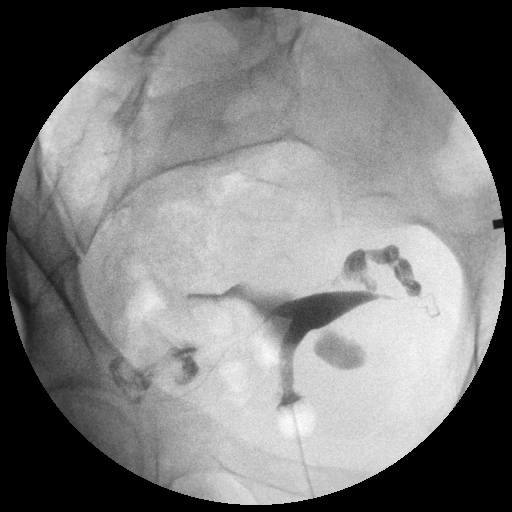

[Series 5: run · 2 of 2 slices shown (5 of 6)]
[im 1/2]
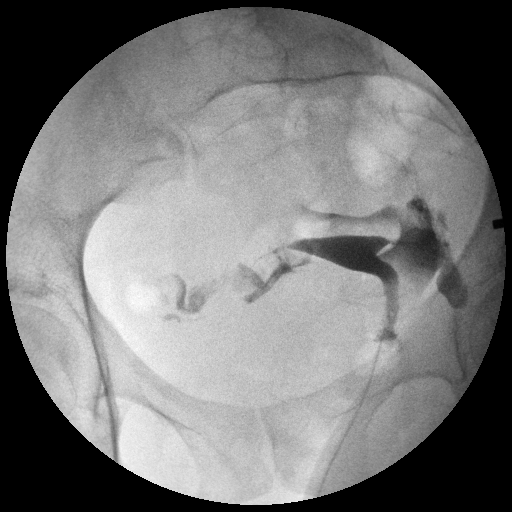
[im 2/2]
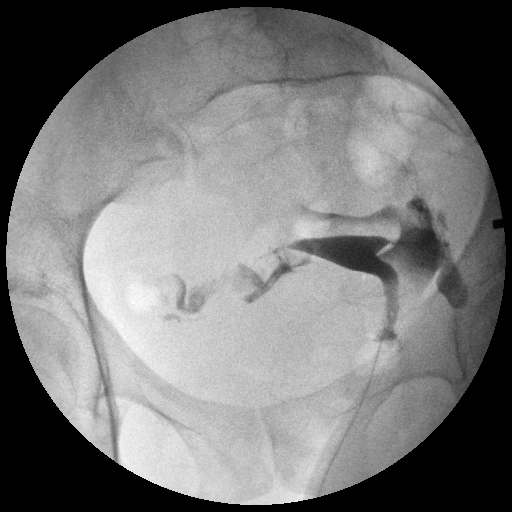

[Series 6: run · 2 of 2 slices shown (6 of 6)]
[im 1/2]
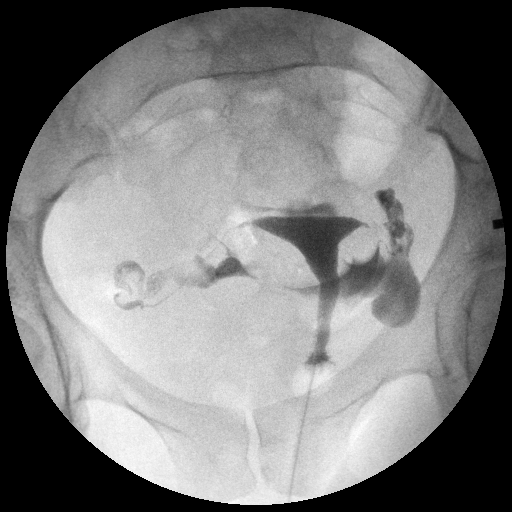
[im 2/2]
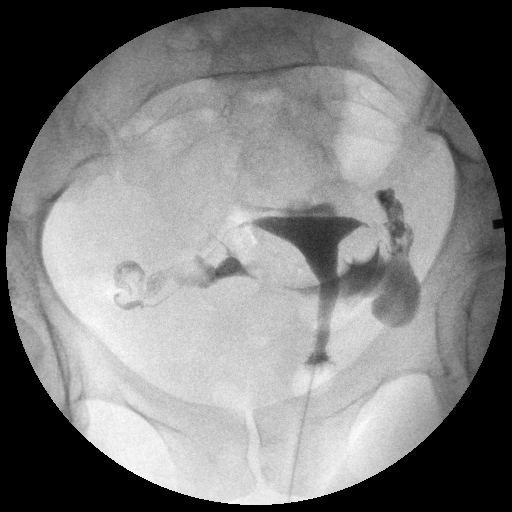

[12 of 12 positions shown; findings below may reference images not displayed]

FLUOROSCOPY TIME:  Fluoroscopy Time:  2 minutes 12 seconds

Number of Acquired Images:  0
FINDINGS: Endometrial Cavity: Normal appearance. No signs of Mullerian duct
anomaly or other significant abnormality.

Right Fallopian Tube: Normal appearance. Free intraperitoneal spill
of contrast is demonstrated.

Left Fallopian Tube: Normal appearance. Free intraperitoneal spill
of contrast is demonstrated.

Other:  None.
IMPRESSION: Normal study. Both fallopian tubes are patent.

## 2019-10-14 ENCOUNTER — Ambulatory Visit (HOSPITAL_BASED_OUTPATIENT_CLINIC_OR_DEPARTMENT_OTHER)
Admission: RE | Admit: 2019-10-14 | Discharge: 2019-10-14 | Disposition: A | Discharge status: Home / Self Care | Payer: PRIVATE HEALTH INSURANCE | Source: Ambulatory Visit | Attending: Internal Medicine | Admitting: Internal Medicine

## 2019-10-14 ENCOUNTER — Other Ambulatory Visit (HOSPITAL_COMMUNITY)
Admission: RE | Admit: 2019-10-14 | Discharge: 2019-10-14 | Disposition: A | Discharge status: Home / Self Care | Payer: PRIVATE HEALTH INSURANCE | Source: Ambulatory Visit | Attending: Internal Medicine | Admitting: Internal Medicine

## 2019-10-14 ENCOUNTER — Encounter: Payer: Self-pay | Admitting: Internal Medicine

## 2019-10-14 ENCOUNTER — Other Ambulatory Visit: Payer: Self-pay

## 2019-10-14 ENCOUNTER — Ambulatory Visit (INDEPENDENT_AMBULATORY_CARE_PROVIDER_SITE_OTHER): Payer: No Typology Code available for payment source | Admitting: Internal Medicine

## 2019-10-14 ENCOUNTER — Encounter (HOSPITAL_BASED_OUTPATIENT_CLINIC_OR_DEPARTMENT_OTHER): Payer: Self-pay

## 2019-10-14 VITALS — BP 106/69 | HR 58 | Temp 97.0°F | Resp 16 | Ht 66.0 in | Wt 140.0 lb

## 2019-10-14 DIAGNOSIS — R102 Pelvic and perineal pain: Secondary | ICD-10-CM

## 2019-10-14 DIAGNOSIS — R319 Hematuria, unspecified: Secondary | ICD-10-CM

## 2019-10-14 LAB — CBC WITH DIFFERENTIAL/PLATELET
Basophils Absolute: 0 10*3/uL (ref 0.0–0.1)
Basophils Relative: 0.2 % (ref 0.0–3.0)
Eosinophils Absolute: 0 10*3/uL (ref 0.0–0.7)
Eosinophils Relative: 0.1 % (ref 0.0–5.0)
HCT: 40.5 % (ref 36.0–46.0)
Hemoglobin: 13.5 g/dL (ref 12.0–15.0)
Lymphocytes Relative: 35.6 % (ref 12.0–46.0)
Lymphs Abs: 2 10*3/uL (ref 0.7–4.0)
MCHC: 33.4 g/dL (ref 30.0–36.0)
MCV: 95.3 fl (ref 78.0–100.0)
Monocytes Absolute: 0.5 10*3/uL (ref 0.1–1.0)
Monocytes Relative: 8.3 % (ref 3.0–12.0)
Neutro Abs: 3.1 10*3/uL (ref 1.4–7.7)
Neutrophils Relative %: 55.8 % (ref 43.0–77.0)
Platelets: 241 10*3/uL (ref 150.0–400.0)
RBC: 4.25 Mil/uL (ref 3.87–5.11)
RDW: 13.3 % (ref 11.5–15.5)
WBC: 5.6 10*3/uL (ref 4.0–10.5)

## 2019-10-14 LAB — URINALYSIS, ROUTINE W REFLEX MICROSCOPIC
Bilirubin Urine: NEGATIVE
Ketones, ur: NEGATIVE
Leukocytes,Ua: NEGATIVE
Nitrite: NEGATIVE
Specific Gravity, Urine: 1.02 (ref 1.000–1.030)
Total Protein, Urine: NEGATIVE
Urine Glucose: NEGATIVE
Urobilinogen, UA: 0.2 (ref 0.0–1.0)
pH: 6 (ref 5.0–8.0)

## 2019-10-14 LAB — POCT URINE PREGNANCY: Preg Test, Ur: NEGATIVE

## 2019-10-14 LAB — POC URINALSYSI DIPSTICK (AUTOMATED)
Bilirubin, UA: NEGATIVE
Glucose, UA: NEGATIVE
Ketones, UA: NEGATIVE
Leukocytes, UA: NEGATIVE
Nitrite, UA: NEGATIVE
Protein, UA: NEGATIVE
Spec Grav, UA: 1.025 (ref 1.010–1.025)
Urobilinogen, UA: 0.2 E.U./dL
pH, UA: 6 (ref 5.0–8.0)

## 2019-10-14 MED ORDER — CEFTRIAXONE SODIUM 500 MG IJ SOLR
500.0000 mg | Freq: Once | INTRAMUSCULAR | Status: AC
  Administered 2019-10-14: 500 mg via INTRAMUSCULAR

## 2019-10-14 MED ORDER — METRONIDAZOLE 500 MG PO TABS: 500.0000 mg | ORAL_TABLET | Freq: Two times a day (BID) | ORAL | 0 refills | Status: DC

## 2019-10-14 MED ORDER — DOXYCYCLINE HYCLATE 100 MG PO TABS
100.0000 mg | ORAL_TABLET | Freq: Two times a day (BID) | ORAL | 0 refills | Status: DC
Start: 2019-10-14 — End: 2019-10-28

## 2019-10-14 MED ORDER — IOHEXOL 300 MG/ML  SOLN
100.0000 mL | Freq: Once | INTRAMUSCULAR | Status: AC | PRN
  Administered 2019-10-14: 100 mL via INTRAVENOUS

## 2019-10-14 NOTE — Progress Notes (Addendum)
Subjective:    Patient ID: Brandi Murray, female    DOB: July 31, 1985, 34 y.o.   MRN: [id number]  DOS:  10/14/2019 Type of visit - description: Acute Symptoms started a week ago with supra pubic discomfort, now the discomfort has moved to the right side of the lower abdomen.  Intensity as high as 6/10.  Denies fever chills No nausea, vomiting, diarrhea. Appetite is okay. BMs are daily. Reports some urinary frequency without dysuria, gross hematuria or difficulty urinating. She is not on birth control pills, she is sexually active with her husband only, LMP 10/05/2019, LMP was normal with no spotting. No vaginal discharge. She does have a history of previous UTIs. She is G2 P0    Review of Systems See above   Past Medical History:  Diagnosis Date  . No known health problems     Past Surgical History:  Procedure Laterality Date  . NO PAST SURGERIES    . WISDOM TOOTH EXTRACTION      Allergies as of 10/14/2019   No Known Allergies     Medication List       Accurate as of October 14, 2019  9:31 AM. If you have any questions, ask your nurse or doctor.        busPIRone 5 MG tablet Commonly known as: BUSPAR Take 1 tablet (5 mg total) by mouth 2 (two) times daily.   letrozole 2.5 MG tablet Commonly known as: FEMARA Take 1 tablet (2.5 mg total) by mouth daily. Take on days 5-9 following a spontaneous menses or progestin-induced bleed.   Vitamin D 50 MCG (2000 UT) Caps Take 1 capsule by mouth daily.          Objective:   Physical Exam Abdominal:      BP 106/69 (BP Location: Left Arm, Patient Position: Sitting, Cuff Size: Small)   Pulse (!) 58   Temp (!) 97 F (36.1 C) (Temporal)   Resp 16   Ht 5\' 6"  (1.676 m)   Wt 140 lb (63.5 kg)   LMP 10/05/2019 (Exact Date)   SpO2 100%   BMI 22.60 kg/m   General:   Well developed, NAD, BMI noted.  HEENT:  Normocephalic . Face symmetric, atraumatic Lungs:  CTA B Normal respiratory effort, no intercostal  retractions, no accessory muscle use. Heart: RRR,  no murmur.  Abdomen:  Not distended  Skin: Not pale. Not jaundice GU: Vagina with white small amount of discharge probably physiologic, cervix nulliparous with no discharge. Bimanual exam: No mass but + CMT to the right. Lower extremities: no pretibial edema bilaterally  Neurologic:  alert & oriented X3.  Speech normal, gait appropriate for age and unassisted Psych--  Cognition and judgment appear intact.  Cooperative with normal attention span and concentration.  Behavior appropriate. No anxious or depressed appearing.     Assessment    34 year old female, healthy, LMP 10/05/2019, not on birth control, presents with:  Right lower quadrant pain: As described above, Udip: + blood; UPT neg   DDx is large but includes PID, UTI, kidney stone (hematuria) and possibly atypical appendicitis. Plan: CBC, CT abdomen and pelvis.  UA urine culture. Cervical sample taken for GC and trichomonas Start empiric PID treatment with Ceftriaxone: Half a gram today Doxycycline 100 mg twice daily Flagyl 500 mg twice daily RTC 2 weeks    This visit occurred during the SARS-CoV-2 public health emergency.  Safety protocols were in place, including screening questions prior to the visit, additional usage of staff  PPE, and extensive cleaning of exam room while observing appropriate contact time as indicated for disinfecting solutions.

## 2019-10-14 NOTE — Patient Instructions (Addendum)
   GO TO THE LAB : Get the blood work     GO TO THE FRONT DESK, PLEASE SCHEDULE YOUR APPOINTMENTS Come back for   a checkup in 2 weeks  Start taking the antibiotics by mouth  You cannot get pregnant while you take antibiotics  Go to the first floor and schedule your CT abdomen and pelvis for today  Call or go to the ER if fever, chills or pain increases.

## 2019-10-14 NOTE — Addendum Note (Signed)
Addended byConrad Addison D on: 10/14/2019 10:15 AM   Modules accepted: Orders

## 2019-10-14 NOTE — Progress Notes (Signed)
Pre visit review using our clinic review tool, if applicable. No additional management support is needed unless otherwise documented below in the visit note. 

## 2019-10-15 ENCOUNTER — Encounter: Payer: Self-pay | Admitting: Internal Medicine

## 2019-10-16 LAB — UNLABELED: Test Ordered On Req: 395

## 2019-10-17 LAB — SPECIMEN ID NOTIFICATION MISSING 2ND ID

## 2019-10-18 LAB — CERVICOVAGINAL ANCILLARY ONLY
Bacterial Vaginitis (gardnerella): POSITIVE — AB
Candida Glabrata: NEGATIVE
Candida Vaginitis: NEGATIVE
Chlamydia: NEGATIVE
Comment: NEGATIVE
Comment: NEGATIVE
Comment: NEGATIVE
Comment: NEGATIVE
Comment: NEGATIVE
Comment: NORMAL
Neisseria Gonorrhea: NEGATIVE
Trichomonas: NEGATIVE

## 2019-10-23 ENCOUNTER — Encounter: Payer: Self-pay | Admitting: Internal Medicine

## 2019-10-28 ENCOUNTER — Other Ambulatory Visit: Payer: Self-pay

## 2019-10-28 ENCOUNTER — Encounter: Payer: Self-pay | Admitting: Internal Medicine

## 2019-10-28 ENCOUNTER — Ambulatory Visit (INDEPENDENT_AMBULATORY_CARE_PROVIDER_SITE_OTHER): Payer: No Typology Code available for payment source | Admitting: Internal Medicine

## 2019-10-28 VITALS — BP 114/67 | HR 75 | Temp 98.7°F | Resp 18 | Ht 66.0 in | Wt 140.2 lb

## 2019-10-28 DIAGNOSIS — K5900 Constipation, unspecified: Secondary | ICD-10-CM | POA: Diagnosis not present

## 2019-10-28 DIAGNOSIS — R102 Pelvic and perineal pain: Secondary | ICD-10-CM | POA: Diagnosis not present

## 2019-10-28 NOTE — Progress Notes (Signed)
   Subjective:    Patient ID: Brandi Murray, female    DOB: 27-Apr-1986, 34 y.o.   MRN: [id number]  DOS:  10/28/2019 Type of visit - description: Follow-up Since the last office visit, was prescribed antibiotics, she completed them. She is actually doing well, currently asymptomatic with no abdominal pain, fever, chills. Did have dysuria for 1 day after the last visit but that is largely resolved.  The CT scan showed increased stool burden, she admits to occasional constipation.   Review of Systems See above   Past Medical History:  Diagnosis Date  . No known health problems     Past Surgical History:  Procedure Laterality Date  . NO PAST SURGERIES    . WISDOM TOOTH EXTRACTION      Allergies as of 10/28/2019   No Known Allergies     Medication List       Accurate as of October 28, 2019  2:08 PM. If you have any questions, ask your nurse or doctor.        busPIRone 5 MG tablet Commonly known as: BUSPAR Take 1 tablet (5 mg total) by mouth 2 (two) times daily.   doxycycline 100 MG tablet Commonly known as: VIBRA-TABS Take 1 tablet (100 mg total) by mouth 2 (two) times daily.   letrozole 2.5 MG tablet Commonly known as: FEMARA Take 1 tablet (2.5 mg total) by mouth daily. Take on days 5-9 following a spontaneous menses or progestin-induced bleed.   metroNIDAZOLE 500 MG tablet Commonly known as: Flagyl Take 1 tablet (500 mg total) by mouth 2 (two) times daily.   Vitamin D 50 MCG (2000 UT) Caps Take 1 capsule by mouth daily.          Objective:   Physical Exam BP 114/67 (BP Location: Left Arm, Patient Position: Sitting, Cuff Size: Small)   Pulse 75   Temp 98.7 F (37.1 C) (Oral)   Resp 18   Ht 5\' 6"  (1.676 m)   Wt 140 lb 4 oz (63.6 kg)   LMP 10/05/2019 (Exact Date) Comment: neg preg test 10/14/19  SpO2 99%   BMI 22.64 kg/m  General:   Well developed, NAD, BMI noted. HEENT:  Normocephalic . Face symmetric, atraumatic Abdomen: Soft, nontender. Lower  extremities: no pretibial edema bilaterally  Skin: Not pale. Not jaundice Neurologic:  alert & oriented X3.  Speech normal, gait appropriate for age and unassisted Psych--  Cognition and judgment appear intact.  Cooperative with normal attention span and concentration.  Behavior appropriate. No anxious or depressed appearing.      Assessment     34 year old female, healthy, LMP 10/05/2019, not on birth control, presents with:  RLQ pain Since the last office visit,CBC was normal urinalysis negative, urine for gonorrhea-chlamydia- trichomonas are negative; CT scan abdomen and pelvis: Normal appendix, moderate colonic stool burden, slightly prominent urinary collecting system, nonspecific. At this point, she was treated empirically for PID and she feels better.  Unfortunately urine culture was RX but not done. Plan: If symptoms resurface, recommend to see her gynecologist or come back here. Constipation: Unrelated to above, she has occasional constipation, with talk about the need to stay hydrated, eat high-fiber diet with fruits and vegetables.   This visit occurred during the SARS-CoV-2 public health emergency.  Safety protocols were in place, including screening questions prior to the visit, additional usage of staff PPE, and extensive cleaning of exam room while observing appropriate contact time as indicated for disinfecting solutions.

## 2019-10-28 NOTE — Progress Notes (Signed)
Pre visit review using our clinic review tool, if applicable. No additional management support is needed unless otherwise documented below in the visit note. 

## 2019-10-28 NOTE — Patient Instructions (Signed)
You are doing well  If your symptoms come back please call your gynecologist or this office

## 2019-12-05 ENCOUNTER — Telehealth: Payer: Self-pay | Admitting: Family Medicine

## 2019-12-05 NOTE — Telephone Encounter (Signed)
Patient called stating that her my chart reflected a generic commercial insurance on it. Patient states she would like that insurance removed. Per patient she does not have that insurance.

## 2020-04-16 ENCOUNTER — Ambulatory Visit: Payer: No Typology Code available for payment source | Admitting: Obstetrics & Gynecology

## 2020-04-26 ENCOUNTER — Other Ambulatory Visit (HOSPITAL_COMMUNITY)
Admission: RE | Admit: 2020-04-26 | Discharge: 2020-04-26 | Disposition: A | Discharge status: Home / Self Care | Payer: No Typology Code available for payment source | Source: Ambulatory Visit | Attending: Obstetrics & Gynecology | Admitting: Obstetrics & Gynecology

## 2020-04-26 ENCOUNTER — Other Ambulatory Visit: Payer: Self-pay

## 2020-04-26 ENCOUNTER — Ambulatory Visit (INDEPENDENT_AMBULATORY_CARE_PROVIDER_SITE_OTHER): Payer: No Typology Code available for payment source | Admitting: Obstetrics & Gynecology

## 2020-04-26 ENCOUNTER — Encounter: Payer: Self-pay | Admitting: Obstetrics & Gynecology

## 2020-04-26 VITALS — Ht 66.0 in | Wt 146.0 lb

## 2020-04-26 DIAGNOSIS — Z01419 Encounter for gynecological examination (general) (routine) without abnormal findings: Secondary | ICD-10-CM

## 2020-04-26 DIAGNOSIS — N979 Female infertility, unspecified: Secondary | ICD-10-CM | POA: Diagnosis not present

## 2020-04-26 DIAGNOSIS — Z0000 Encounter for general adult medical examination without abnormal findings: Secondary | ICD-10-CM | POA: Diagnosis not present

## 2020-04-26 NOTE — Progress Notes (Signed)
Patient presents for annual exam. Patient would like to discuss fertility (mAB 09-09-2018 and 03-2019)

## 2020-04-26 NOTE — Progress Notes (Signed)
Subjective:     Brandi Murray is a 34 y.o. female here for a routine exam. G2P0020. Pt reports monthly cycles. She reports pain monthly with her cycles and a mid cycles pain for a few hours. Her cycles occur every 26 days. She was on the Tyro for 4 cycles with no conception so she stopped them. She feels like she is ready now to see REI. She has discussed this with her husband. His semen analysis was prev WNL.    Gynecologic History Patient's last menstrual period was 04/03/2020 (exact date). Contraception: none Last Pap: 01/25/2018. Results were: normal Last mammogram: n/a.   Obstetric History OB History  Gravida Para Term Preterm AB Living  2 0 0 0 2 0  SAB IAB Ectopic Multiple Live Births  2 0 0 0 0    # Outcome Date GA Lbr Len/2nd Weight Sex Delivery Anes PTL Lv  2 SAB 04/28/19          1 SAB 09/09/18 [redacted]w[redacted]d          The following portions of the patient's history were reviewed and updated as appropriate: allergies, current medications, past family history, past medical history, past social history, past surgical history and problem list.  Review of Systems Pertinent items are noted in HPI.    Objective:  Ht 5\' 6"  (1.676 m)   Wt 146 lb (66.2 kg)   LMP 04/03/2020 (Exact Date)   BMI 23.57 kg/m   General Appearance:    Alert, cooperative, no distress, appears stated age  Head:    Normocephalic, without obvious abnormality, atraumatic  Eyes:    conjunctiva/corneas clear, EOM's intact, both eyes  Ears:    Normal external ear canals, both ears  Nose:   Nares normal, septum midline, mucosa normal, no drainage    or sinus tenderness  Throat:   Lips, mucosa, and tongue normal; teeth and gums normal  Neck:   Supple, symmetrical, trachea midline, no adenopathy;    thyroid:  no enlargement/tenderness/nodules  Back:     Symmetric, no curvature, ROM normal, no CVA tenderness  Lungs:     respirations unlabored  Chest Wall:    No tenderness or deformity   Heart:    Regular rate and  rhythm  Breast Exam:    No tenderness, masses, or nipple abnormality  Abdomen:     Soft, non-tender, bowel sounds active all four quadrants,    no masses, no organomegaly  Genitalia:    Normal female without lesion, discharge or tenderness     Extremities:   Extremities normal, atraumatic, no cyanosis or edema  Pulses:   2+ and symmetric all extremities  Skin:   Skin color, texture, turgor normal, no rashes or lesions    Assessment:    Healthy female exam.   Secondary infertility- h/o 2 prior SABs.    Plan:   Diagnoses and all orders for this visit:  Well woman exam with routine gynecological exam -     Cytology - PAP( )  Well adult exam  Female infertility, secondary -     Ambulatory referral to Endocrinology  Pt will see Dr. 14/10/2019 Keep PNV daily F/u in 1 year or sooner prn   Rawley Harju L. Harraway-Smith, M.D., Margarite Gouge

## 2020-04-28 NOTE — L&D Delivery Note (Signed)
Delivery Note At 8:15 AM a viable female was delivered via Vaginal, Spontaneous (Presentation: Left Occiput Anterior).  APGAR: 8, 9; weight  pending.  After 1 minute, the cord was clamped and cut. 40 units of pitocin diluted in 1000cc LR was infused rapidly IV.  The placenta partially separated spontaneously; the cord was tearing and the pt was bleeding,  so it was manually removed.  It was inspected and appears to be intact with a 3 VC.  Velamentous insertion.  Mefoxin 2gm  Anesthesia: None Episiotomy: None Lacerations: 2nd degree Suture Repair: 2.0 vicryl Est. Blood Loss (mL): 712  Mom to postpartum.  Baby to Couplet care / Skin to Skin.  Brandi Murray 03/12/2021, 8:58 AM

## 2020-05-01 LAB — CYTOLOGY - PAP
Comment: NEGATIVE
Diagnosis: NEGATIVE
Diagnosis: REACTIVE
High risk HPV: NEGATIVE

## 2020-05-08 ENCOUNTER — Telehealth: Payer: Self-pay | Admitting: General Practice

## 2020-05-08 NOTE — Telephone Encounter (Signed)
Patient is scheduled with Dr. April Manson on 08/23/2020 at 3:00pm.

## 2020-05-21 ENCOUNTER — Encounter: Payer: No Typology Code available for payment source | Admitting: Family Medicine

## 2020-05-25 ENCOUNTER — Ambulatory Visit: Payer: No Typology Code available for payment source | Admitting: Family Medicine

## 2020-06-25 ENCOUNTER — Encounter: Payer: Self-pay | Admitting: Family Medicine

## 2020-06-25 ENCOUNTER — Other Ambulatory Visit: Payer: Self-pay

## 2020-06-25 ENCOUNTER — Ambulatory Visit (INDEPENDENT_AMBULATORY_CARE_PROVIDER_SITE_OTHER): Payer: BC Managed Care – PPO | Admitting: Family Medicine

## 2020-06-25 VITALS — BP 108/62 | HR 60 | Temp 98.1°F | Ht 66.0 in | Wt 149.2 lb

## 2020-06-25 DIAGNOSIS — Z1159 Encounter for screening for other viral diseases: Secondary | ICD-10-CM | POA: Diagnosis not present

## 2020-06-25 DIAGNOSIS — E559 Vitamin D deficiency, unspecified: Secondary | ICD-10-CM | POA: Diagnosis not present

## 2020-06-25 DIAGNOSIS — Z0000 Encounter for general adult medical examination without abnormal findings: Secondary | ICD-10-CM | POA: Diagnosis not present

## 2020-06-25 DIAGNOSIS — G44209 Tension-type headache, unspecified, not intractable: Secondary | ICD-10-CM | POA: Diagnosis not present

## 2020-06-25 LAB — COMPREHENSIVE METABOLIC PANEL
ALT: 20 U/L (ref 0–35)
AST: 19 U/L (ref 0–37)
Albumin: 4.1 g/dL (ref 3.5–5.2)
Alkaline Phosphatase: 42 U/L (ref 39–117)
BUN: 10 mg/dL (ref 6–23)
CO2: 27 mEq/L (ref 19–32)
Calcium: 9.4 mg/dL (ref 8.4–10.5)
Chloride: 106 mEq/L (ref 96–112)
Creatinine, Ser: 0.68 mg/dL (ref 0.40–1.20)
GFR: 114.2 mL/min (ref >60.00)
Glucose, Bld: 89 mg/dL (ref 70–99)
Potassium: 4 mEq/L (ref 3.5–5.1)
Sodium: 138 mEq/L (ref 135–145)
Total Bilirubin: 0.4 mg/dL (ref 0.2–1.2)
Total Protein: 6.7 g/dL (ref 6.0–8.3)

## 2020-06-25 LAB — CBC
HCT: 40.6 % (ref 36.0–46.0)
Hemoglobin: 13.6 g/dL (ref 12.0–15.0)
MCHC: 33.4 g/dL (ref 30.0–36.0)
MCV: 94.9 fl (ref 78.0–100.0)
Platelets: 241 10*3/uL (ref 150.0–400.0)
RBC: 4.28 Mil/uL (ref 3.87–5.11)
RDW: 12.8 % (ref 11.5–15.5)
WBC: 5 10*3/uL (ref 4.0–10.5)

## 2020-06-25 LAB — LIPID PANEL
Cholesterol: 144 mg/dL (ref 0–200)
HDL: 47.9 mg/dL (ref >39.00)
LDL Cholesterol: 75 mg/dL (ref 0–99)
NonHDL: 96.06
Total CHOL/HDL Ratio: 3
Triglycerides: 106 mg/dL (ref 0.0–149.0)
VLDL: 21.2 mg/dL (ref 0.0–40.0)

## 2020-06-25 LAB — VITAMIN D 25 HYDROXY (VIT D DEFICIENCY, FRACTURES): VITD: 24.3 ng/mL — ABNORMAL LOW (ref 30.00–100.00)

## 2020-06-25 MED ORDER — MELOXICAM 15 MG PO TABS: 15.0000 mg | ORAL_TABLET | Freq: Every day | ORAL | 0 refills | Status: DC

## 2020-06-25 NOTE — Patient Instructions (Addendum)
Give [place] 2-3 business days to get the results of your labs back.   Keep the diet clean and stay active.  Aim to do some physical exertion for 150 minutes per week. This is typically divided into 5 days per week, 30 minutes per day. The activity should be enough to get your heart rate up. Anything is better than nothing if you have time constraints.  I would recommend getting the booster for covid.  Coping skills Choose 5 that work for you:  Take a deep breath  Count to 20  Read a book  Do a puzzle  Meditate  Bake  Sing  Knit  Garden  Pray  Go outside  Call a friend  Listen to music  Take a walk  Color  Send a note  Take a bath  Watch a movie  Be alone in a quiet place  Pet an animal  Visit a friend  Journal  Exercise  Stretch   Let us know if you need anything.  Trapezius stretches/exercises Do exercises exactly as told by your health care provider and adjust them as directed. It is normal to feel mild stretching, pulling, tightness, or discomfort as you do these exercises, but you should stop right away if you feel sudden pain or your pain gets worse.  Stretching and range of motion exercises These exercises warm up your muscles and joints and improve the movement and flexibility of your shoulder. These exercises can also help to relieve pain, numbness, and tingling. If you are unable to do any of the following for any reason, do not further attempt to do it.   Exercise A: Flexion, standing    1. Stand and hold a broomstick, a cane, or a similar object. Place your hands a little more than shoulder-width apart on the object. Your left / right hand should be palm-up, and your other hand should be palm-down. 2. Push the stick to raise your left / right arm out to your side and then over your head. Use your other hand to help move the stick. Stop when you feel a stretch in your shoulder, or when you reach the angle that is recommended by your  health care provider. ? Avoid shrugging your shoulder while you raise your arm. Keep your shoulder blade tucked down toward your spine. 3. Hold for 30 seconds. 4. Slowly return to the starting position. Repeat 2 times. Complete this exercise 3 times per week.  Exercise B: Abduction, supine    1. Lie on your back and hold a broomstick, a cane, or a similar object. Place your hands a little more than shoulder-width apart on the object. Your left / right hand should be palm-up, and your other hand should be palm-down. 2. Push the stick to raise your left / right arm out to your side and then over your head. Use your other hand to help move the stick. Stop when you feel a stretch in your shoulder, or when you reach the angle that is recommended by your health care provider. ? Avoid shrugging your shoulder while you raise your arm. Keep your shoulder blade tucked down toward your spine. 3. Hold for 30 seconds. 4. Slowly return to the starting position. Repeat 2 times. Complete this exercise 3 times per week.  Exercise C: Flexion, active-assisted    1. Lie on your back. You may bend your knees for comfort. 2. Hold a broomstick, a cane, or a similar object. Place your hands about shoulder-width apart  on the object. Your palms should face toward your feet. 3. Raise the stick and move your arms over your head and behind your head, toward the floor. Use your healthy arm to help your left / right arm move farther. Stop when you feel a gentle stretch in your shoulder, or when you reach the angle where your health care provider tells you to stop. 4. Hold for 30 seconds. 5. Slowly return to the starting position. Repeat 2 times. Complete this exercise 3 times per week.  Exercise D: External rotation and abduction    1. Stand in a door frame with one of your feet slightly in front of the other. This is called a staggered stance. 2. Choose one of the following positions as told by your health care  provider: ? Place your hands and forearms on the door frame above your head. ? Place your hands and forearms on the door frame at the height of your head. ? Place your hands on the door frame at the height of your elbows. 3. Slowly move your weight onto your front foot until you feel a stretch across your chest and in the front of your shoulders. Keep your head and chest upright and keep your abdominal muscles tight. 4. Hold for 30 seconds. 5. To release the stretch, shift your weight to your back foot. Repeat 2 times. Complete this stretch 3 times per week.  Strengthening exercises These exercises build strength and endurance in your shoulder. Endurance is the ability to use your muscles for a long time, even after your muscles get tired. Exercise E: Scapular depression and adduction  1. Sit on a stable chair. Support your arms in front of you with pillows, armrests, or a tabletop. Keep your elbows in line with the sides of your body. 2. Gently move your shoulder blades down toward your middle back. Relax the muscles on the tops of your shoulders and in the back of your neck. 3. Hold for 3 seconds. 4. Slowly release the tension and relax your muscles completely before doing this exercise again. Repeat for a total of 10 repetitions. 5. After you have practiced this exercise, try doing the exercise without the arm support. Then, try the exercise while standing instead of sitting. Repeat 2 times. Complete this exercise 3 times per week.  Exercise F: Shoulder abduction, isometric    1. Stand or sit about 4-6 inches (10-15 cm) from a wall with your left / right side facing the wall. 2. Bend your left / right elbow and gently press your elbow against the wall. 3. Increase the pressure slowly until you are pressing as hard as you can without shrugging your shoulder. 4. Hold for 3 seconds. 5. Slowly release the tension and relax your muscles completely. Repeat for a total of 10  repetitions. Repeat 2 times. Complete this exercise 3 times per week.  Exercise G: Shoulder flexion, isometric    1. Stand or sit about 4-6 inches (10-15 cm) away from a wall with your left / right side facing the wall. 2. Keep your left / right elbow straight and gently press the top of your fist against the wall. Increase the pressure slowly until you are pressing as hard as you can without shrugging your shoulder. 3. Hold for 10-15 seconds. 4. Slowly release the tension and relax your muscles completely. Repeat for a total of 10 repetitions. Repeat 2 times. Complete this exercise 3 times per week.  Exercise H: Internal rotation  1. Sit in a stable chair without armrests, or stand. Secure an exercise band at your left / right side, at elbow height. 2. Place a soft object, such as a folded towel or a small pillow, under your left / right upper arm so your elbow is a few inches (about 8 cm) away from your side. 3. Hold the end of the exercise band so the band stretches. 4. Keeping your elbow pressed against the soft object under your arm, move your forearm across your body toward your abdomen. Keep your body steady so the movement is only coming from your shoulder. 5. Hold for 3 seconds. 6. Slowly return to the starting position. Repeat for a total of 10 repetitions. Repeat 2 times. Complete this exercise 3 times per week.  Exercise I: External rotation    1. Sit in a stable chair without armrests, or stand. 2. Secure an exercise band at your left / right side, at elbow height. 3. Place a soft object, such as a folded towel or a small pillow, under your left / right upper arm so your elbow is a few inches (about 8 cm) away from your side. 4. Hold the end of the exercise band so the band stretches. 5. Keeping your elbow pressed against the soft object under your arm, move your forearm out, away from your abdomen. Keep your body steady so the movement is only coming from your  shoulder. 6. Hold for 3 seconds. 7. Slowly return to the starting position. Repeat for a total of 10 repetitions. Repeat 2 times. Complete this exercise 3 times per week. Exercise J: Shoulder extension  1. Sit in a stable chair without armrests, or stand. Secure an exercise band to a stable object in front of you so the band is at shoulder height. 2. Hold one end of the exercise band in each hand. Your palms should face each other. 3. Straighten your elbows and lift your hands up to shoulder height. 4. Step back, away from the secured end of the exercise band, until the band stretches. 5. Squeeze your shoulder blades together and pull your hands down to the sides of your thighs. Stop when your hands are straight down by your sides. Do not let your hands go behind your body. 6. Hold for 3 seconds. 7. Slowly return to the starting position. Repeat for a total of 10 repetitions. Repeat 2 times. Complete this exercise 3 times per week.  Exercise K: Shoulder extension, prone    1. Lie on your abdomen on a firm surface so your left / right arm hangs over the edge. 2. Hold a 5 lb weight in your hand so your palm faces in toward your body. Your arm should be straight. 3. Squeeze your shoulder blade down toward the middle of your back. 4. Slowly raise your arm behind you, up to the height of the surface that you are lying on. Keep your arm straight. 5. Hold for 3 seconds. 6. Slowly return to the starting position and relax your muscles. Repeat for a total of 10 repetitions. Repeat 2 times. Complete this exercise 3 times per week.   Exercise L: Horizontal abduction, prone  1. Lie on your abdomen on a firm surface so your left / right arm hangs over the edge. 2. Hold a 5 lb weight in your hand so your palm faces toward your feet. Your arm should be straight. 3. Squeeze your shoulder blade down toward the middle of your back. 4. Bend your  elbow so your hand moves up, until your elbow is bent to an  "L" shape (90 degrees). With your elbow bent, slowly move your forearm forward and up. Raise your hand up to the height of the surface that you are lying on. ? Your upper arm should not move, and your elbow should stay bent. ? At the top of the movement, your palm should face the floor. 5. Hold for 3 seconds. 6. Slowly return to the starting position and relax your muscles. Repeat for a total of 10 repetitions. Repeat 2 times. Complete this exercise 3 times per week.  Exercise M: Horizontal abduction, standing  1. Sit on a stable chair, or stand. 2. Secure an exercise band to a stable object in front of you so the band is at shoulder height. 3. Hold one end of the exercise band in each hand. 4. Straighten your elbows and lift your hands straight in front of you, up to shoulder height. Your palms should face down, toward the floor. 5. Step back, away from the secured end of the exercise band, until the band stretches. 6. Move your arms out to your sides, and keep your arms straight. 7. Hold for 3 seconds. 8. Slowly return to the starting position. Repeat for a total of 10 repetitions. Repeat 2 times. Complete this exercise 3 times per week.  Exercise N: Scapular retraction and elevation  1. Sit on a stable chair, or stand. 2. Secure an exercise band to a stable object in front of you so the band is at shoulder height. 3. Hold one end of the exercise band in each hand. Your palms should face each other. 4. Sit in a stable chair without armrests, or stand. 5. Step back, away from the secured end of the exercise band, until the band stretches. 6. Squeeze your shoulder blades together and lift your hands over your head. Keep your elbows straight. 7. Hold for 3 seconds. 8. Slowly return to the starting position. Repeat for a total of 10 repetitions. Repeat 2 times. Complete this exercise 3 times per week.  This information is not intended to replace advice given to you by your health care  provider. Make sure you discuss any questions you have with your health care provider. Document Released: 04/14/2005 Document Revised: 12/20/2015 Document Reviewed: 03/01/2015 Elsevier Interactive Patient Education  2017 [employer].

## 2020-06-25 NOTE — Progress Notes (Signed)
Chief Complaint  Patient presents with  . Annual Exam  . Headache     Well Woman Brandi Murray is here for a complete physical.   Her last physical was around 1 year ago.  Current diet: in general, diet could be better.  Current exercise: walking. Fatigue out of ordinary? No Seatbelt? Yes Gained a little Loss of interested in doing things or depression in past 2 weeks? Yes; dealing w anxiety; she is following w a counselor/psychologist. Was on bupropion, does not wish for anything at this time.   Health Maintenance Pap/HPV- Yes Tetanus- Yes HIV screening- Yes Hep C screening- No   Headaches 1 week of headaches over temples. Comes and goes, cold weather seems to exacerbate. Assoc dizziness. No neuro s/s's, vision changes, N/V. 7/10 severity, no radiation, aching/pressure. Heat helps. Tried Tylenol, little relief.    Past Medical History:  Diagnosis Date  . No known health problems     Past Surgical History:  Procedure Laterality Date  . NO PAST SURGERIES    . WISDOM TOOTH EXTRACTION      Medications  Taking no medications routinely.   Allergies No Known Allergies  Review of Systems: Constitutional:  no unexpected weight changes Eye:  no recent significant change in vision Ear/Nose/Mouth/Throat:  Ears:  no tinnitus or vertigo and no recent change in hearing Nose/Mouth/Throat:  no complaints of nasal congestion, no sore throat Cardiovascular: no chest pain Respiratory:  no cough and no shortness of breath Gastrointestinal:  no abdominal pain, no change in bowel habits GU:  Female: negative for dysuria or pelvic pain Musculoskeletal/Extremities:  +intermittent neck pain Integumentary (Skin/Breast):  no abnormal skin lesions reported Neurologic:  + headaches Endocrine:  denies fatigue Hematologic/Lymphatic:  No areas of easy bleeding  Exam BP 108/62 (BP Location: Left Arm, Patient Position: Sitting, Cuff Size: Normal)   Pulse 60   Temp 98.1 F (36.7 C) (Oral)    Ht 5\' 6"  (1.676 m)   Wt 149 lb 4 oz (67.7 kg)   SpO2 98%   BMI 24.09 kg/m  General:  well developed, well nourished, in no apparent distress Skin:  no significant moles, warts, or growths Head:  no masses, lesions, or tenderness Eyes:  pupils equal and round, sclera anicteric without injection Ears:  canals without lesions, TMs shiny without retraction, no obvious effusion, no erythema Nose:  nares patent, septum midline, mucosa normal, and no drainage or sinus tenderness Throat/Pharynx:  lips and gingiva without lesion; tongue and uvula midline; non-inflamed pharynx; no exudates or postnasal drainage Neck: neck supple without adenopathy, thyromegaly, or masses Lungs:  clear to auscultation, breath sounds equal bilaterally, no respiratory distress Cardio:  regular rate and rhythm, no bruits, no LE edema Abdomen:  abdomen soft, nontender; bowel sounds normal; no masses or organomegaly Genital: Defer to GYN Musculoskeletal: +TTP over the b/l trap area Extremities:  no clubbing, cyanosis, or edema, no deformities, no skin discoloration Neuro:  gait normal; deep tendon reflexes normal and symmetric Psych: well oriented with normal range of affect and appropriate judgment/insight  Assessment and Plan  Well adult exam - Plan: Comprehensive metabolic panel, Lipid panel, CBC  Encounter for hepatitis C screening test for low risk patient - Plan: Hepatitis C antibody  Vitamin D insufficiency - Plan: VITAMIN D 25 Hydroxy (Vit-D Deficiency, Fractures)  Tension headache   Well 35 y.o. female. Counseled on diet and exercise. Other orders as above. Mobic as needed. Stop if she becomes pregnant. Tylenol. Heat, stretches/exercises.  Follow up  in 1 yr or prn. The patient voiced understanding and agreement to the plan.  Jilda Roche Greybull, DO 06/25/20 8:16 AM

## 2020-06-26 LAB — HEPATITIS C ANTIBODY
Hepatitis C Ab: NONREACTIVE
SIGNAL TO CUT-OFF: 0.01 (ref <1.00)

## 2020-07-31 ENCOUNTER — Ambulatory Visit (INDEPENDENT_AMBULATORY_CARE_PROVIDER_SITE_OTHER): Payer: BC Managed Care – PPO

## 2020-07-31 ENCOUNTER — Other Ambulatory Visit: Payer: Self-pay

## 2020-07-31 VITALS — BP 119/57 | HR 71 | Ht 66.0 in

## 2020-07-31 DIAGNOSIS — O209 Hemorrhage in early pregnancy, unspecified: Secondary | ICD-10-CM

## 2020-07-31 NOTE — Progress Notes (Addendum)
Patient presents for ultrasound. Patient is 6-2weeks based off LMP 06-15-2020. Patient sent for HCG today. Patient reports bleeding around 4 weeks that was light pink in color. On Sunday and Monday   (approx 6 weeks) she reports some brown discharge.  DATING AND VIABILITY SONOGRAM   Brandi Murray is a 35 y.o. year old G2P0020 with LMP Patient's last menstrual period was 06/15/2020 (exact date). which would correlate to  Unknown weeks gestation.  She has regular menstrual cycles.   She is here today for a confirmatory initial sonogram.   Fetal pole identified.  FHR 110bpm  ADNEXA: The ovaries are normal.   GESTATIONAL AGE AND  BIOMETRICS:  Gestational criteria: Estimated Date of Delivery: None noted. by LMP now at 03-22-2021  Previous Scans:1  GESTATIONAL SAC           5-6 cm         5-6 weeks                                                                                   AVERAGE EGA(BY THIS SCAN):  5-6 weeks  WORKING EDD( LMP ):  03-22-2021     TECHNICIAN COMMENTS: Patient informed that the ultrasound is considered a limited obstetric ultrasound and is not intended to be a complete ultrasound exam. Patient also informed that the ultrasound is not being completed with the intent of assessing for fetal or placental anomalies or any pelvic abnormalities. Explained that the purpose of today's ultrasound is to assess for fetal heart rate. Patient acknowledges the purpose of the exam and the limitations of the study.    Armandina Stammer 07/31/2020 10:51 AM

## 2020-08-01 LAB — BETA HCG QUANT (REF LAB): hCG Quant: 24037 m[IU]/mL

## 2020-08-23 ENCOUNTER — Ambulatory Visit (INDEPENDENT_AMBULATORY_CARE_PROVIDER_SITE_OTHER): Payer: BC Managed Care – PPO | Admitting: Family Medicine

## 2020-08-23 ENCOUNTER — Other Ambulatory Visit: Payer: Self-pay

## 2020-08-23 ENCOUNTER — Encounter: Payer: Self-pay | Admitting: Family Medicine

## 2020-08-23 VITALS — BP 134/60 | HR 78 | Wt 145.0 lb

## 2020-08-23 DIAGNOSIS — Z3480 Encounter for supervision of other normal pregnancy, unspecified trimester: Secondary | ICD-10-CM | POA: Diagnosis not present

## 2020-08-23 DIAGNOSIS — Z3A09 9 weeks gestation of pregnancy: Secondary | ICD-10-CM | POA: Diagnosis not present

## 2020-08-23 DIAGNOSIS — O209 Hemorrhage in early pregnancy, unspecified: Secondary | ICD-10-CM

## 2020-08-23 NOTE — Progress Notes (Signed)
  Subjective:  Brandi Murray is a G3P0020 at [redacted]w[redacted]d by LMP c/w 1st trimester [place] with being seen today for her first obstetrical visit.  Her obstetrical history is significant for spontaneos abortion x2. Planned pregnancy. FOB is patient's spouse. Patient does intend to breast feed. Pregnancy history fully reviewed.  Patient reports nausea and decreased appetite. Has only had one episode of vomiting. Still eating a normal diet.   BP 134/60   Pulse 78   Wt 145 lb (65.8 kg)   LMP 06/15/2020 (Exact Date)   BMI 23.40 kg/m   HISTORY: OB History  Gravida Para Term Preterm AB Living  3 0 0 0 2 0  SAB IAB Ectopic Multiple Live Births  2 0 0 0 0    # Outcome Date GA Lbr Len/2nd Weight Sex Delivery Anes PTL Lv  3 Current           2 SAB 04/28/19          1 SAB 09/09/18 [redacted]w[redacted]d           Past Medical History:  Diagnosis Date  . No known health problems     Past Surgical History:  Procedure Laterality Date  . NO PAST SURGERIES    . WISDOM TOOTH EXTRACTION      Family History  Problem Relation Age of Onset  . Epilepsy Father   . Cancer Neg Hx   . Heart disease Neg Hx      Exam  BP 134/60   Pulse 78   Wt 145 lb (65.8 kg)   LMP 06/15/2020 (Exact Date)   BMI 23.40 kg/m   Chaperone present during exam  CONSTITUTIONAL: Well-developed, well-nourished female in no acute distress.  HENT:  Normocephalic, atraumatic EYES: Conjunctivae and EOM are normal. No scleral icterus.  NECK: Normal range of motion, supple, no masses.  Normal thyroid.  CARDIOVASCULAR: Normal heart rate noted, regular rhythm RESPIRATORY: Clear to auscultation bilaterally. Effort and breath sounds normal, no problems with respiration noted. BREASTS: Symmetric in size. No masses, skin changes, nipple drainage, or lymphadenopathy. ABDOMEN: Soft, normal bowel sounds, no distention noted.  No tenderness, rebound or guarding.  PELVIC: Normal appearing external genitalia; normal appearing vaginal mucosa and cervix. No  abnormal discharge noted. MUSCULOSKELETAL: Normal range of motion. No tenderness.  No cyanosis, clubbing, or edema. SKIN: Skin is warm and dry. No rash noted. Not diaphoretic. No erythema. No pallor. NEUROLOGIC: Alert and oriented to person, place, and time. Normal reflexes, muscle tone coordination. No cranial nerve deficit noted. PSYCHIATRIC: Normal mood and affect. Normal behavior. Normal judgment and thought content.    Assessment:    Pregnancy: G2P0020 Patient Active Problem List   Diagnosis Date Noted  . Vitamin D insufficiency 05/11/2017  . Well adult exam 05/11/2017      Plan:   1. Supervision of Low Risk Pregnancy, 9 weeks    Initial labs obtained Continue prenatal vitamins Reviewed n/v relief measures and warning s/s to report Reviewed recommended weight gain based on pre-gravid BMI Encouraged well-balanced diet Genetic & carrier screening discussed: requests Panorama and [facility] 14 ,  Ultrasound discussed; fetal survey: requested  The nature of [facility] [employer] with multiple MDs and other Advanced Practice Providers was explained to patient; also emphasized that fellows, residents, and students are part of our team.  Levie Heritage, DO  08/23/2020

## 2020-08-23 NOTE — Progress Notes (Signed)
DATING AND VIABILITY SONOGRAM   Brandi Murray is a 35 y.o. year old G3P0020 with LMP Patient's last menstrual period was 06/15/2020 (exact date). which would correlate to  [redacted]w[redacted]d weeks gestation.  She has regular menstrual cycles.   She is here today for a confirmatory initial sonogram.    GESTATION: SINGLETON     FETAL ACTIVITY:          Heart rate         181 bpm          The fetus is active.    ADNEXA: The ovaries are normal.   GESTATIONAL AGE AND  BIOMETRICS:  Gestational criteria: Estimated Date of Delivery: 03/22/21 by LMP now at [redacted]w[redacted]d  Previous Scans:0      CROWN RUMP LENGTH           cm        9-3* weeks                                                                               AVERAGE EGA(BY THIS SCAN):  9-3 weeks  WORKING EDD( LMP ):  03/22/2021     TECHNICIAN COMMENTS: Patient informed that the ultrasound is considered a limited obstetric ultrasound and is not intended to be a complete ultrasound exam. Patient also informed that the ultrasound is not being completed with the intent of assessing for fetal or placental anomalies or any pelvic abnormalities. Explained that the purpose of today's ultrasound is to assess for fetal heart rate. Patient acknowledges the purpose of the exam and the limitations of the study.    Armandina Stammer 08/23/2020 10:16 AM

## 2020-08-24 LAB — CBC/D/PLT+RPR+RH+ABO+RUB AB...
Antibody Screen: NEGATIVE
Basophils Absolute: 0 10*3/uL (ref 0.0–0.2)
Basos: 0 %
EOS (ABSOLUTE): 0 10*3/uL (ref 0.0–0.4)
Eos: 0 %
HCV Ab: <0.1 s/co ratio (ref 0.0–0.9)
HIV Screen 4th Generation wRfx: NONREACTIVE
Hematocrit: 38.7 % (ref 34.0–46.6)
Hemoglobin: 13 g/dL (ref 11.1–15.9)
Hepatitis B Surface Ag: NEGATIVE
Immature Grans (Abs): 0 10*3/uL (ref 0.0–0.1)
Immature Granulocytes: 0 %
Lymphocytes Absolute: 1.4 10*3/uL (ref 0.7–3.1)
Lymphs: 20 %
MCH: 31.9 pg (ref 26.6–33.0)
MCHC: 33.6 g/dL (ref 31.5–35.7)
MCV: 95 fL (ref 79–97)
Monocytes Absolute: 0.6 10*3/uL (ref 0.1–0.9)
Monocytes: 9 %
Neutrophils Absolute: 4.9 10*3/uL (ref 1.4–7.0)
Neutrophils: 71 %
Platelets: 253 10*3/uL (ref 150–450)
RBC: 4.08 x10E6/uL (ref 3.77–5.28)
RDW: 11.9 % (ref 11.7–15.4)
RPR Ser Ql: NONREACTIVE
Rh Factor: POSITIVE
Rubella Antibodies, IGG: 6.82 index (ref >0.99)
WBC: 6.9 10*3/uL (ref 3.4–10.8)

## 2020-08-24 LAB — HCV INTERPRETATION

## 2020-08-26 LAB — URINE CULTURE: Organism ID, Bacteria: NO GROWTH

## 2020-09-20 ENCOUNTER — Other Ambulatory Visit (HOSPITAL_COMMUNITY)
Admission: RE | Admit: 2020-09-20 | Discharge: 2020-09-20 | Disposition: A | Discharge status: Home / Self Care | Payer: BC Managed Care – PPO | Source: Ambulatory Visit | Attending: Obstetrics & Gynecology | Admitting: Obstetrics & Gynecology

## 2020-09-20 ENCOUNTER — Ambulatory Visit (INDEPENDENT_AMBULATORY_CARE_PROVIDER_SITE_OTHER): Payer: BC Managed Care – PPO | Admitting: Obstetrics & Gynecology

## 2020-09-20 ENCOUNTER — Other Ambulatory Visit: Payer: Self-pay

## 2020-09-20 VITALS — BP 108/63 | HR 77 | Wt 142.0 lb

## 2020-09-20 DIAGNOSIS — Z3A13 13 weeks gestation of pregnancy: Secondary | ICD-10-CM | POA: Diagnosis not present

## 2020-09-20 DIAGNOSIS — Z3481 Encounter for supervision of other normal pregnancy, first trimester: Secondary | ICD-10-CM | POA: Insufficient documentation

## 2020-09-20 DIAGNOSIS — K5900 Constipation, unspecified: Secondary | ICD-10-CM

## 2020-09-20 DIAGNOSIS — Z3480 Encounter for supervision of other normal pregnancy, unspecified trimester: Secondary | ICD-10-CM

## 2020-09-20 DIAGNOSIS — O99619 Diseases of the digestive system complicating pregnancy, unspecified trimester: Secondary | ICD-10-CM

## 2020-09-20 DIAGNOSIS — K219 Gastro-esophageal reflux disease without esophagitis: Secondary | ICD-10-CM

## 2020-09-20 NOTE — Progress Notes (Signed)
   PRENATAL VISIT NOTE  Subjective:  Brandi Murray is a 35 y.o. G3P0020 at 107w6d being seen today for ongoing prenatal care.  She is currently monitored for the following issues for this low-risk pregnancy and has Vitamin D insufficiency; Well adult exam; and Supervision of other normal pregnancy, antepartum on their problem list.  Patient reports heartburn and nausea.  Contractions: Not present. Vag. Bleeding: None.  Movement: Absent. Denies leaking of fluid.   The following portions of the patient's history were reviewed and updated as appropriate: allergies, current medications, past family history, past medical history, past social history, past surgical history and problem list.   Objective:   Vitals:   09/20/20 1102  BP: 108/63  Pulse: 77  Weight: 142 lb (64.4 kg)    Fetal Status: Fetal Heart Rate (bpm): 153   Movement: Absent     General:  Alert, oriented and cooperative. Patient is in no acute distress.  Skin: Skin is warm and dry. No rash noted.   Cardiovascular: Normal heart rate noted  Respiratory: Normal respiratory effort, no problems with respiration noted  Abdomen: Soft, gravid, appropriate for gestational age.  Pain/Pressure: Absent     Pelvic: Cervical exam deferred        Extremities: Normal range of motion.  Edema: None  Mental Status: Normal mood and affect. Normal behavior. Normal judgment and thought content.   Assessment and Plan:  Pregnancy: G3P0020 at [redacted]w[redacted]d 1. [redacted] weeks gestation of pregnancy Rec ginger for mild nausea Reviewed meds for reflux  Pt will f/u in 1 week for NIPS (kits were out this week).   2. Supervision of other normal pregnancy, antepartum Reviewed meds for chronic constipation. Pt will start Miralax.    Obstetric precautions including but not limited to vaginal bleeding, contractions, leaking of fluid and fetal movement were reviewed in detail with the patient. Please refer to After Visit Summary for other counseling recommendations.    Return in about 4 weeks (around 10/18/2020).  Future Appointments  Date Time Provider Department Center  09/27/2020  9:00 AM [facility] NURSE [facility] None  10/17/2020  8:45 AM Willodean Rosenthal, MD [facility] None  10/30/2020 10:45 AM [facility] US[facility]  11/14/2020  9:00 AM Willodean Rosenthal, MD [facility] None    Willodean Rosenthal, MD

## 2020-09-20 NOTE — Patient Instructions (Signed)
Prilosec or Famvir for gastric reflux  Miralax for constipation

## 2020-09-21 LAB — GC/CHLAMYDIA PROBE AMP (~~LOC~~) NOT AT ARMC
Chlamydia: NEGATIVE
Comment: NEGATIVE
Comment: NORMAL
Neisseria Gonorrhea: NEGATIVE

## 2020-09-25 ENCOUNTER — Other Ambulatory Visit: Payer: BC Managed Care – PPO

## 2020-09-27 ENCOUNTER — Other Ambulatory Visit (INDEPENDENT_AMBULATORY_CARE_PROVIDER_SITE_OTHER): Payer: BC Managed Care – PPO

## 2020-09-27 ENCOUNTER — Other Ambulatory Visit: Payer: Self-pay

## 2020-09-27 DIAGNOSIS — Z31430 Encounter of female for testing for genetic disease carrier status for procreative management: Secondary | ICD-10-CM | POA: Diagnosis not present

## 2020-09-27 DIAGNOSIS — Z3A14 14 weeks gestation of pregnancy: Secondary | ICD-10-CM

## 2020-09-27 DIAGNOSIS — Z36 Encounter for antenatal screening for chromosomal anomalies: Secondary | ICD-10-CM

## 2020-09-27 NOTE — Progress Notes (Signed)
Pt presents for Panorama. Panorama labs were done and sent to [facility].  Brandi Murray l Brandi Murray, CMA

## 2020-09-27 NOTE — Progress Notes (Signed)
Chart reviewed - agree with CMA/RN documentation.  ° °

## 2020-10-12 ENCOUNTER — Encounter: Payer: Self-pay | Admitting: General Practice

## 2020-10-17 ENCOUNTER — Other Ambulatory Visit: Payer: Self-pay

## 2020-10-17 ENCOUNTER — Ambulatory Visit (INDEPENDENT_AMBULATORY_CARE_PROVIDER_SITE_OTHER): Payer: BC Managed Care – PPO | Admitting: Obstetrics & Gynecology

## 2020-10-17 VITALS — BP 108/60 | HR 79 | Wt 146.0 lb

## 2020-10-17 DIAGNOSIS — K219 Gastro-esophageal reflux disease without esophagitis: Secondary | ICD-10-CM

## 2020-10-17 DIAGNOSIS — E559 Vitamin D deficiency, unspecified: Secondary | ICD-10-CM

## 2020-10-17 DIAGNOSIS — Z3480 Encounter for supervision of other normal pregnancy, unspecified trimester: Secondary | ICD-10-CM

## 2020-10-17 DIAGNOSIS — Z3A17 17 weeks gestation of pregnancy: Secondary | ICD-10-CM

## 2020-10-17 DIAGNOSIS — O99619 Diseases of the digestive system complicating pregnancy, unspecified trimester: Secondary | ICD-10-CM

## 2020-10-17 NOTE — Progress Notes (Signed)
   PRENATAL VISIT NOTE  Subjective:  Brandi Murray is a 35 y.o. G3P0020 at [redacted]w[redacted]d being seen today for ongoing prenatal care.  She is currently monitored for the following issues for this low-risk pregnancy and has Vitamin D insufficiency and Supervision of other normal pregnancy, antepartum on their problem list.  Patient reports heartburn.  Contractions: Not present. Vag. Bleeding: None.  Movement: Absent. Denies leaking of fluid.   The following portions of the patient's history were reviewed and updated as appropriate: allergies, current medications, past family history, past medical history, past social history, past surgical history and problem list.   Objective:   Vitals:   10/17/20 0858  BP: 108/60  Pulse: 79  Weight: 146 lb (66.2 kg)    Fetal Status: Fetal Heart Rate (bpm): 152   Movement: Absent     General:  Alert, oriented and cooperative. Patient is in no acute distress.  Skin: Skin is warm and dry. No rash noted.   Cardiovascular: Normal heart rate noted  Respiratory: Normal respiratory effort, no problems with respiration noted  Abdomen: Soft, gravid, appropriate for gestational age.  Pain/Pressure: Absent     Pelvic: Cervical exam deferred        Extremities: Normal range of motion.  Edema: None  Mental Status: Normal mood and affect. Normal behavior. Normal judgment and thought content.   Assessment and Plan:  Pregnancy: G3P0020 at [redacted]w[redacted]d 1. [redacted] weeks gestation of pregnancy +FHR - AFP, Serum, Open Spina Bifida  2. Supervision of other normal pregnancy, antepartum Anatomy scan scheduled  3. Vitamin D insufficiency  4. Reflux Cont Prilosec  Preterm labor symptoms and general obstetric precautions including but not limited to vaginal bleeding, contractions, leaking of fluid and fetal movement were reviewed in detail with the patient. Please refer to After Visit Summary for other counseling recommendations.   Return in about 4 weeks (around  11/14/2020).  Future Appointments  Date Time Provider Department Center  10/30/2020 10:45 AM [facility] US5 [facility]  11/14/2020  9:00 AM Anyanwu, Jethro Bastos, MD [facility] None    Willodean Rosenthal, MD

## 2020-10-17 NOTE — Patient Instructions (Signed)

## 2020-10-19 LAB — AFP, SERUM, OPEN SPINA BIFIDA
AFP MoM: 1.51
AFP Value: 65.2 ng/mL
Gest. Age on Collection Date: 17.5 weeks
Maternal Age At EDD: 34.4 yr
OSBR Risk 1 IN: 2661
Test Results:: NEGATIVE
Weight: 146 [lb_av]

## 2020-10-30 ENCOUNTER — Ambulatory Visit: Payer: BC Managed Care – PPO

## 2020-10-30 ENCOUNTER — Other Ambulatory Visit: Payer: Self-pay

## 2020-10-30 ENCOUNTER — Ambulatory Visit: Payer: BC Managed Care – PPO | Attending: Obstetrics & Gynecology

## 2020-10-30 ENCOUNTER — Other Ambulatory Visit: Payer: Self-pay | Admitting: *Deleted

## 2020-10-30 DIAGNOSIS — Z3482 Encounter for supervision of other normal pregnancy, second trimester: Secondary | ICD-10-CM | POA: Diagnosis not present

## 2020-10-30 DIAGNOSIS — O359XX0 Maternal care for (suspected) fetal abnormality and damage, unspecified, not applicable or unspecified: Secondary | ICD-10-CM

## 2020-10-30 DIAGNOSIS — Z3480 Encounter for supervision of other normal pregnancy, unspecified trimester: Secondary | ICD-10-CM

## 2020-10-30 DIAGNOSIS — Z3A19 19 weeks gestation of pregnancy: Secondary | ICD-10-CM | POA: Insufficient documentation

## 2020-10-30 DIAGNOSIS — Z3A13 13 weeks gestation of pregnancy: Secondary | ICD-10-CM

## 2020-10-30 DIAGNOSIS — Z363 Encounter for antenatal screening for malformations: Secondary | ICD-10-CM

## 2020-10-30 DIAGNOSIS — Z362 Encounter for other antenatal screening follow-up: Secondary | ICD-10-CM

## 2020-11-14 ENCOUNTER — Other Ambulatory Visit: Payer: Self-pay

## 2020-11-14 ENCOUNTER — Ambulatory Visit (INDEPENDENT_AMBULATORY_CARE_PROVIDER_SITE_OTHER): Payer: BC Managed Care – PPO | Admitting: Obstetrics & Gynecology

## 2020-11-14 VITALS — BP 102/63 | HR 115 | Wt 151.0 lb

## 2020-11-14 DIAGNOSIS — Z3A21 21 weeks gestation of pregnancy: Secondary | ICD-10-CM

## 2020-11-14 DIAGNOSIS — R399 Unspecified symptoms and signs involving the genitourinary system: Secondary | ICD-10-CM

## 2020-11-14 DIAGNOSIS — Z3480 Encounter for supervision of other normal pregnancy, unspecified trimester: Secondary | ICD-10-CM

## 2020-11-14 LAB — POCT URINALYSIS DIPSTICK OB
Bilirubin, UA: NEGATIVE
Blood, UA: NEGATIVE
Glucose, UA: NEGATIVE
Ketones, UA: NEGATIVE
Nitrite, UA: NEGATIVE
POC,PROTEIN,UA: NEGATIVE
Spec Grav, UA: 1.015 (ref 1.010–1.025)
Urobilinogen, UA: 0.2 E.U./dL
pH, UA: 8.5 — AB (ref 5.0–8.0)

## 2020-11-14 NOTE — Progress Notes (Signed)
PRENATAL VISIT NOTE  Subjective:  Brandi Murray is a 35 y.o. G3P0020 at [redacted]w[redacted]d being seen today for ongoing prenatal care.  She is currently monitored for the following issues for this low-risk pregnancy and has Vitamin D insufficiency and Supervision of other normal pregnancy, antepartum on their problem list.  Patient reports  one episode of pain during urination yesterday, not today. Concerned about UTI, No fevers, abdominal pain .  Contractions: Not present. Vag. Bleeding: None.  Movement: Present. Denies leaking of fluid.   The following portions of the patient's history were reviewed and updated as appropriate: allergies, current medications, past family history, past medical history, past social history, past surgical history and problem list.   Objective:   Vitals:   11/14/20 0912  BP: 102/63  Pulse: (!) 115  Weight: 151 lb (68.5 kg)    Fetal Status: Fetal Heart Rate (bpm): 156   Movement: Present     General:  Alert, oriented and cooperative. Patient is in no acute distress.  Skin: Skin is warm and dry. No rash noted.   Cardiovascular: Normal heart rate noted  Respiratory: Normal respiratory effort, no problems with respiration noted  Abdomen: Soft, gravid, appropriate for gestational age.  Pain/Pressure: Absent     Pelvic: Cervical exam deferred        Extremities: Normal range of motion.  Edema: None  Mental Status: Normal mood and affect. Normal behavior. Normal judgment and thought content.   Results for orders placed or performed in visit on 11/14/20 (from the past 24 hour(s))  POC Urinalysis Dipstick OB     Status: Abnormal   Collection Time: 11/14/20  9:56 AM  Result Value Ref Range   Color, UA     Clarity, UA clear    Glucose, UA Negative Negative   Bilirubin, UA negative    Ketones, UA negative    Spec Grav, UA 1.015 1.010 - 1.025   Blood, UA negative    pH, UA 8.5 (A) 5.0 - 8.0   POC,PROTEIN,UA Negative Negative, Trace, Small (1+), Moderate (2+), Large  (3+), 4+   Urobilinogen, UA 0.2 0.2 or 1.0 E.U./dL   Nitrite, UA negative    Leukocytes, UA Small (1+) (A) Negative   Appearance     Odor     [place] MFM OB COMP + 14 WK  Result Date: 10/30/2020 ----------------------------------------------------------------------  OBSTETRICS REPORT                       (Signed Final 10/30/2020 11:59 am) ---------------------------------------------------------------------- Patient Info  ID #:       [id number]                          D.O.B.:  August 02, 1985 (35 yrs)  Name:       Brandi Murray                  Visit Date: 10/30/2020 11:30 am ---------------------------------------------------------------------- Performed By  Attending:        Ma Rings MD         Ref. Address:     [address]  [address] [place],                                                             [place] [address]  Performed By:     Jeanmarie Plant       [facility]  Referred By:      Willodean Rosenthal MD ---------------------------------------------------------------------- Orders  #  Description                           Code        Ordered By  1  [place] MFM OB COMP + 14 WK                78295.62    Willodean Rosenthal ----------------------------------------------------------------------  #  Order #                     Accession #                Episode #  1  [id number]                   [id number]                 [id number] ---------------------------------------------------------------------- Indications  Fetal abnormality - other known or suspected   O35.9XX0  [redacted] weeks gestation of pregnancy                Z3A.19  LR  Female  Neg Horizon  Neg AFP  SAB x2  Encounter for antenatal screening for          Z36.3  malformations ---------------------------------------------------------------------- Fetal Evaluation  Num Of Fetuses:         1  Preg. Location:         Intrauterine  Fetal Heart Rate(bpm):  147  Cardiac Activity:       Observed  Presentation:           Cephalic  Placenta:               Fundal  P. Cord Insertion:      Visualized  Amniotic Fluid  AFI FV:      Within normal limits                              Largest Pocket(cm)                              4.42 ---------------------------------------------------------------------- Biometry  BPD:      45.5  mm     G. Age:  19w 5d         58  %    CI:        69.51   %    70 - 86                                                          FL/HC:      19.2   %    16.8 - 19.8  HC:      174.2  mm     G. Age:  20w 0d         61  %    HC/AC:      1.12        1.09 - 1.39  AC:      155.7  mm     G. Age:  20w 5d         81  %    FL/BPD:     73.4   %  FL:       33.4  mm     G. Age:  20w 3d         74  %    FL/AC:      21.5   %    20 - 24  HUM:      31.1  mm     G. Age:  20w 2d         72  %  CER:      21.2  mm     G. Age:  20w 1d         82  %  NFT:       4.8  mm  LV:        7.6  mm  CM:        4.9  mm  Est. FW:     359  gm    0 lb 13 oz      92  % ---------------------------------------------------------------------- OB History  Blood Type:   O+  Gravidity:    3          SAB:   2 ---------------------------------------------------------------------- Gestational Age  LMP:           19w 4d  Date:  06/15/20                 EDD:   03/22/21  U/S Today:     20w 2d                                        EDD:   03/17/21  Best:          19w 4d     Det. By:  LMP  (06/15/20)          EDD:   03/22/21 ---------------------------------------------------------------------- Anatomy  Cranium:               Appears normal         LVOT:                   Limited Views  Cavum:                 Appears  normal         Aortic Arch:            Appears normal  Ventricles:            Appears normal         Ductal Arch:            Appears normal  Choroid Plexus:        Appears normal         Diaphragm:              Appears normal  Cerebellum:            Appears normal         Stomach:                Appears normal, left                                                                        sided  Posterior Fossa:       Appears normal         Abdomen:                Appears normal  Nuchal Fold:           Appears normal         Abdominal Wall:         Appears nml (cord                                                                        insert, abd wall)  Face:                  Appears normal         Cord Vessels:           Appears normal (3                         (  orbits and profile)                           vessel cord)  Lips:                  Appears normal         Kidneys:                Appear normal  Palate:                Limited Views          Bladder:                Appears normal  Thoracic:              Appears normal         Spine:                  Appears normal  Heart:                 Limited Views          Upper Extremities:      Visualized  RVOT:                  Not well visualized    Lower Extremities:      Visualized ---------------------------------------------------------------------- Targeted Anatomy  Central Nervous System  Calvarium/Cranial V.:  Appears normal         Choroid Plexus:         Appears normal  Intracranial Anat:     Appears normal         Cereb./Vermis:          Appears normal  Cavum:                 Appears normal         Cisterna Magna:         Appears normal  Parenchyma:            Appears normal         Midline Falx:           Appears normal  Lateral Ventricles:    Appears normal  Spine  Cervical:              Appears normal         Sacral:                 Appears Normal  Thoracic:              Appears normal         Shape/Curvature:        Appears normal  Lumbar:                 Appears normal  Head/Neck  Face:                  Appears normal         Palate:                 Limited Views  Lips:                  Appears normal         Profile:                Appears normal  Nuchal Fold:           Appears normal  Orbits/Eyes:            Appears normal  Nasal Bone:            Present  Thorax  4 Chamber View:        Limited Views          Aortic Arch:            Appears normal  Cardiac Activity:      Observed               Ductal Arch:            Appears normal  Cardiac Rhythm:        Normal                 SVC:                    Appears normal  Cardiac Situs:         Appears normal         Diaphragm:              Appears normal  Lt Outflow Tract:      Limited Views          IVC:                    Appears normal  Abdomen  Ventral Wall:          Appears normal         Lt Kidney:              Appears normal  Cord Insertion:        Appears normal         Rt Kidney:              Appears normal  Situs:                 Within normal limits   Bladder:                Appears normal  Stomach:               Appears normal  Extremities  Lt Humerus:            Appears normal         Lt Femur:               Appears normal  Rt Humerus:            Appears normal         Rt Femur:               Appears normal  Lt Forearm:            Appears normal         Lt Lower Leg:           Appears normal  Rt Forearm:            Appears normal         Rt Lower Leg:           Appears normal  Lt Hand:               Limited Views          Lt Foot:                Limited Views  Rt Hand:               Limited  Views          Rt Foot:                Limited Views  Other  Umbilical Cord:        Normal 3-vessel        Genitalia:              Female  Comment:     Limited Views = seen but not able to clear ---------------------------------------------------------------------- Cervix Uterus Adnexa  Cervix  Length:           3.77  cm.  Normal appearance by transabdominal scan.  Uterus  No abnormality visualized.  Right Ovary   Not visualized.  Left Ovary  Not visualized.  Cul De Sac  No free fluid seen.  Adnexa  No abnormality visualized. ---------------------------------------------------------------------- Comments  This patient was seen for a detailed fetal anatomy scan.  She denies any significant past medical history and denies  any problems in her current pregnancy.  She had a cell free DNA test earlier in her pregnancy which  indicated a low risk for trisomy 35, 58, and 13. A female fetus  is predicted.  She was informed that the fetal growth and amniotic fluid  level were appropriate for her gestational age.  There were no obvious fetal anomalies noted on today's  ultrasound exam.  However, the views of the fetal anatomy  were limited today due to the fetal position.  The patient was informed that anomalies may be missed due  to technical limitations. If the fetus is in a suboptimal position  or maternal habitus is increased, visualization of the fetus in  the maternal uterus may be impaired.  A follow-up exam was scheduled in 4 weeks to complete the  views of the fetal anatomy. ----------------------------------------------------------------------                   Ma Rings, MD Electronically Signed Final Report   10/30/2020 11:59 am ----------------------------------------------------------------------    Assessment and Plan:  Pregnancy: G3P0020 at [redacted]w[redacted]d 1. UTI symptoms Will follow up culture and manage accordingly. - POC Urinalysis Dipstick OB - Culture, OB Urine  2. [redacted] weeks gestation of pregnancy 3. Supervision of other normal pregnancy, antepartum Follow up MFM scan as scheduled. Preterm labor symptoms and general obstetric precautions including but not limited to vaginal bleeding, contractions, leaking of fluid and fetal movement were reviewed in detail with the patient. Please refer to After Visit Summary for other counseling recommendations.   Return in about 4 weeks (around 12/12/2020) for OFFICE OB  VISIT (MD or APP).  Future Appointments  Date Time Provider Department Center  11/28/2020 11:15 AM [facility] NURSE [facility]  11/28/2020 11:30 AM [facility] US3 [facility]  12/11/2020  1:10 PM Brand Males, CNM [facility]  01/02/2021  8:10 AM Rasch, Harolyn Rutherford, NP [facility]  01/17/2021  8:10 AM Milas Hock, MD [facility]    Jaynie Collins, MD

## 2020-11-16 LAB — CULTURE, OB URINE

## 2020-11-16 LAB — URINE CULTURE, OB REFLEX: Organism ID, Bacteria: NO GROWTH

## 2020-11-28 ENCOUNTER — Other Ambulatory Visit: Payer: Self-pay

## 2020-11-28 ENCOUNTER — Encounter: Payer: Self-pay | Admitting: *Deleted

## 2020-11-28 ENCOUNTER — Ambulatory Visit: Payer: BC Managed Care – PPO | Admitting: *Deleted

## 2020-11-28 ENCOUNTER — Ambulatory Visit: Payer: BC Managed Care – PPO | Attending: Obstetrics

## 2020-11-28 VITALS — BP 102/63 | HR 79

## 2020-11-28 DIAGNOSIS — Z362 Encounter for other antenatal screening follow-up: Secondary | ICD-10-CM | POA: Diagnosis not present

## 2020-11-28 DIAGNOSIS — Z3A23 23 weeks gestation of pregnancy: Secondary | ICD-10-CM

## 2020-12-10 ENCOUNTER — Encounter: Payer: BC Managed Care – PPO | Admitting: Obstetrics and Gynecology

## 2020-12-11 ENCOUNTER — Other Ambulatory Visit: Payer: Self-pay

## 2020-12-11 ENCOUNTER — Ambulatory Visit (INDEPENDENT_AMBULATORY_CARE_PROVIDER_SITE_OTHER): Payer: BC Managed Care – PPO

## 2020-12-11 VITALS — BP 99/60 | HR 84 | Wt 158.0 lb

## 2020-12-11 DIAGNOSIS — Z3480 Encounter for supervision of other normal pregnancy, unspecified trimester: Secondary | ICD-10-CM

## 2020-12-11 DIAGNOSIS — Z3A25 25 weeks gestation of pregnancy: Secondary | ICD-10-CM

## 2020-12-11 NOTE — Progress Notes (Signed)
   PRENATAL VISIT NOTE  Subjective:  Brandi Murray is a 35 y.o. G3P0020 at [redacted]w[redacted]d being seen today for ongoing prenatal care.  She is currently monitored for the following issues for this low-risk pregnancy and has Vitamin D insufficiency and Supervision of other normal pregnancy, antepartum on their problem list.  Patient reports no complaints.  Contractions: Not present. Vag. Bleeding: None.  Movement: Present. Denies leaking of fluid.   The following portions of the patient's history were reviewed and updated as appropriate: allergies, current medications, past family history, past medical history, past social history, past surgical history and problem list.   Objective:   Vitals:   12/11/20 1304  BP: 99/60  Pulse: 84  Weight: 158 lb (71.7 kg)    Fetal Status: Fetal Heart Rate (bpm): 147   Movement: Present     General:  Alert, oriented and cooperative. Patient is in no acute distress.  Skin: Skin is warm and dry. No rash noted.   Cardiovascular: Normal heart rate noted  Respiratory: Normal respiratory effort, no problems with respiration noted  Abdomen: Soft, gravid, appropriate for gestational age.  Pain/Pressure: Absent     Pelvic: Cervical exam deferred        Extremities: Normal range of motion.  Edema: Trace  Mental Status: Normal mood and affect. Normal behavior. Normal judgment and thought content.   Assessment and Plan:  Pregnancy: G3P0020 at [redacted]w[redacted]d  1. [redacted] weeks gestation of pregnancy 2. Supervision of other normal pregnancy, antepartum - Routine OB care. Doing well. No concerns - Transfer from [facility] office - Active fetal movement. BP normotensive - Anticipatory guidance for upcoming appointment. GTT and labs next visit   Preterm labor symptoms and general obstetric precautions including but not limited to vaginal bleeding, contractions, leaking of fluid and fetal movement were reviewed in detail with the patient. Please refer to After Visit Summary for other  counseling recommendations.   Return in about 3 weeks (around 01/01/2021).  Future Appointments  Date Time Provider Department Center  01/02/2021  8:10 AM Rasch, Harolyn Rutherford, NP [facility]  01/17/2021  8:10 AM Milas Hock, MD [facility]     Brand Males, CNM 12/11/20 1:18 PM

## 2021-01-02 ENCOUNTER — Other Ambulatory Visit: Payer: Self-pay

## 2021-01-02 ENCOUNTER — Ambulatory Visit (INDEPENDENT_AMBULATORY_CARE_PROVIDER_SITE_OTHER): Payer: BC Managed Care – PPO | Admitting: Obstetrics and Gynecology

## 2021-01-02 ENCOUNTER — Encounter: Payer: BC Managed Care – PPO | Admitting: Family Medicine

## 2021-01-02 DIAGNOSIS — Z3480 Encounter for supervision of other normal pregnancy, unspecified trimester: Secondary | ICD-10-CM | POA: Diagnosis not present

## 2021-01-02 NOTE — Progress Notes (Signed)
   PRENATAL VISIT NOTE  Subjective:  Brandi Murray is a 35 y.o. G3P0020 at [redacted]w[redacted]d being seen today for ongoing prenatal care.  She is currently monitored for the following issues for this low-risk pregnancy and has Vitamin D insufficiency and Supervision of other normal pregnancy, antepartum on their problem list.  Patient reports no complaints.  Contractions: Not present. Vag. Bleeding: None.  Movement: Present. Denies leaking of fluid.   The following portions of the patient's history were reviewed and updated as appropriate: allergies, current medications, past family history, past medical history, past social history, past surgical history and problem list.   Objective:   Vitals:   01/02/21 0816  BP: (!) 96/57  Pulse: 68  Weight: 161 lb (73 kg)    Fetal Status: Fetal Heart Rate (bpm): 141   Movement: Present     General:  Alert, oriented and cooperative. Patient is in no acute distress.  Skin: Skin is warm and dry. No rash noted.   Cardiovascular: Normal heart rate noted  Respiratory: Normal respiratory effort, no problems with respiration noted  Abdomen: Soft, gravid, appropriate for gestational age.  Pain/Pressure: Absent     Pelvic: Cervical exam deferred        Extremities: Normal range of motion.  Edema: Trace  Mental Status: Normal mood and affect. Normal behavior. Normal judgment and thought content.   Assessment and Plan:  Pregnancy: G3P0020 at [redacted]w[redacted]d 1. Supervision of other normal pregnancy, antepartum  - 2Hr GTT w/ 1 Hr Carpenter 75 g - HIV antibody (with reflex) - CBC - RPR  - Declined TDAP, information given at checkout. She would like to read about it and decide at next visit.     Preterm labor symptoms and general obstetric precautions including but not limited to vaginal bleeding, contractions, leaking of fluid and fetal movement were reviewed in detail with the patient. Please refer to After Visit Summary for other counseling recommendations.   No  follow-ups on file.  Future Appointments  Date Time Provider Department Center  01/17/2021  8:10 AM Milas Hock, MD [facility]    Venia Carbon, NP

## 2021-01-02 NOTE — Progress Notes (Signed)
Pt c/o more swelling in right foot than left

## 2021-01-03 LAB — CBC
HCT: 33.6 % — ABNORMAL LOW (ref 35.0–45.0)
Hemoglobin: 11.4 g/dL — ABNORMAL LOW (ref 11.7–15.5)
MCH: 32.5 pg (ref 27.0–33.0)
MCHC: 33.9 g/dL (ref 32.0–36.0)
MCV: 95.7 fL (ref 80.0–100.0)
MPV: 11.7 fL (ref 7.5–12.5)
Platelets: 242 10*3/uL (ref 140–400)
RBC: 3.51 10*6/uL — ABNORMAL LOW (ref 3.80–5.10)
RDW: 11.8 % (ref 11.0–15.0)
WBC: 8.2 10*3/uL (ref 3.8–10.8)

## 2021-01-03 LAB — 2HR GTT W 1 HR, CARPENTER, 75 G
Glucose, 1 Hr, Gest: 120 mg/dL (ref 65–179)
Glucose, 2 Hr, Gest: 101 mg/dL (ref 65–152)
Glucose, Fasting, Gest: 72 mg/dL (ref 65–91)

## 2021-01-03 LAB — HIV ANTIBODY (ROUTINE TESTING W REFLEX): HIV 1&2 Ab, 4th Generation: NONREACTIVE

## 2021-01-03 LAB — RPR: RPR Ser Ql: NONREACTIVE

## 2021-01-15 NOTE — Progress Notes (Signed)
   PRENATAL VISIT NOTE  Subjective:  Brandi Murray is a 35 y.o. G3P0020 at [redacted]w[redacted]d being seen today for ongoing prenatal care.  She is currently monitored for the following issues for this low-risk pregnancy and has Vitamin D insufficiency and Supervision of other normal pregnancy, antepartum on their problem list.  Patient reports  edema in feet, but no pain in calves .  Contractions: Not present. Vag. Bleeding: None.  Movement: Present. Denies leaking of fluid.   The following portions of the patient's history were reviewed and updated as appropriate: allergies, current medications, past family history, past medical history, past social history, past surgical history and problem list.   Objective:   Vitals:   01/17/21 0814  BP: 110/61  Pulse: 87  Weight: 161 lb (73 kg)    Fetal Status: Fetal Heart Rate (bpm): 145 Fundal Height: 30 cm Movement: Present     General:  Alert, oriented and cooperative. Patient is in no acute distress.  Skin: Skin is warm and dry. No rash noted.   Cardiovascular: Normal heart rate noted  Respiratory: Normal respiratory effort, no problems with respiration noted  Abdomen: Soft, gravid, appropriate for gestational age.  Pain/Pressure: Present     Pelvic: Cervical exam deferred        Extremities: Normal range of motion.  Edema: Mild pitting, slight indentation limited to feet  Mental Status: Normal mood and affect. Normal behavior. Normal judgment and thought content.   Assessment and Plan:  Pregnancy: G3P0020 at [redacted]w[redacted]d 1. Supervision of other normal pregnancy, antepartum - 28w labs negative - Tdap offered last time - she wanted to review. Discussed today - she declines. We discussed purpose of tdap. Discussed if she doesn't want in pregnancy, I would still recommend PP. Recommend all family update their booster as well. Spouse at bedside and informed too.  - Reviewed edema - discussed s/sx of dvt. Currently none - edema limited to feet.  - f/u 2  weeks  Preterm labor symptoms and general obstetric precautions including but not limited to vaginal bleeding, contractions, leaking of fluid and fetal movement were reviewed in detail with the patient. Please refer to After Visit Summary for other counseling recommendations.   Return in about 2 weeks (around 01/31/2021) for OB VISIT, MD or APP.  Future Appointments  Date Time Provider Department Center  02/04/2021  8:10 AM Milas Hock, MD [facility]     Milas Hock, MD

## 2021-01-17 ENCOUNTER — Encounter: Payer: Self-pay | Admitting: Obstetrics and Gynecology

## 2021-01-17 ENCOUNTER — Other Ambulatory Visit: Payer: Self-pay

## 2021-01-17 ENCOUNTER — Encounter: Payer: BC Managed Care – PPO | Admitting: Family Medicine

## 2021-01-17 ENCOUNTER — Ambulatory Visit (INDEPENDENT_AMBULATORY_CARE_PROVIDER_SITE_OTHER): Payer: BC Managed Care – PPO | Admitting: Obstetrics and Gynecology

## 2021-01-17 VITALS — BP 110/61 | HR 87 | Wt 161.0 lb

## 2021-01-17 DIAGNOSIS — Z3480 Encounter for supervision of other normal pregnancy, unspecified trimester: Secondary | ICD-10-CM

## 2021-02-02 NOTE — Progress Notes (Signed)
   PRENATAL VISIT NOTE  Subjective:  Brandi Murray is a 35 y.o. G3P0020 at [redacted]w[redacted]d being seen today for ongoing prenatal care.  She is currently monitored for the following issues for this low-risk pregnancy and has Vitamin D insufficiency and Supervision of other normal pregnancy, antepartum on their problem list.  Patient reports no complaints.  Contractions: Not present. Vag. Bleeding: None.  Movement: Present. Denies leaking of fluid.   The following portions of the patient's history were reviewed and updated as appropriate: allergies, current medications, past family history, past medical history, past social history, past surgical history and problem list.   Objective:   Vitals:   02/04/21 0819  BP: 104/61  Pulse: 69  Weight: 169 lb (76.7 kg)    Fetal Status: Fetal Heart Rate (bpm): 153 Fundal Height: 34 cm Movement: Present     General:  Alert, oriented and cooperative. Patient is in no acute distress.  Skin: Skin is warm and dry. No rash noted.   Cardiovascular: Normal heart rate noted  Respiratory: Normal respiratory effort, no problems with respiration noted  Abdomen: Soft, gravid, appropriate for gestational age.  Pain/Pressure: Present     Pelvic: Cervical exam deferred        Extremities: Normal range of motion.  Edema: Mild pitting, slight indentation  Mental Status: Normal mood and affect. Normal behavior. Normal judgment and thought content.   Assessment and Plan:  Pregnancy: G3P0020 at [redacted]w[redacted]d 1. Supervision of other normal pregnancy, antepartum - f/u in 2 weeks. Discussed GBS at 36w.  - she voices her only plan/hope is for healthy baby - however that is safely accomplished.   Preterm labor symptoms and general obstetric precautions including but not limited to vaginal bleeding, contractions, leaking of fluid and fetal movement were reviewed in detail with the patient. Please refer to After Visit Summary for other counseling recommendations.   Return in about 2  weeks (around 02/18/2021) for OB VISIT, MD or APP.  Future Appointments  Date Time Provider Department Center  02/19/2021  1:50 PM Kathlene Cote [facility]     Milas Hock, MD

## 2021-02-04 ENCOUNTER — Encounter: Payer: Self-pay | Admitting: Obstetrics and Gynecology

## 2021-02-04 ENCOUNTER — Other Ambulatory Visit: Payer: Self-pay

## 2021-02-04 ENCOUNTER — Ambulatory Visit (INDEPENDENT_AMBULATORY_CARE_PROVIDER_SITE_OTHER): Payer: BC Managed Care – PPO | Admitting: Obstetrics and Gynecology

## 2021-02-04 VITALS — BP 104/61 | HR 69 | Wt 169.0 lb

## 2021-02-04 DIAGNOSIS — Z3480 Encounter for supervision of other normal pregnancy, unspecified trimester: Secondary | ICD-10-CM

## 2021-02-19 ENCOUNTER — Encounter: Payer: Self-pay | Admitting: Medical

## 2021-02-19 ENCOUNTER — Other Ambulatory Visit: Payer: Self-pay

## 2021-02-19 ENCOUNTER — Ambulatory Visit (INDEPENDENT_AMBULATORY_CARE_PROVIDER_SITE_OTHER): Payer: BC Managed Care – PPO | Admitting: Medical

## 2021-02-19 VITALS — BP 119/76 | HR 78 | Wt 173.0 lb

## 2021-02-19 DIAGNOSIS — Z3480 Encounter for supervision of other normal pregnancy, unspecified trimester: Secondary | ICD-10-CM

## 2021-02-19 DIAGNOSIS — Z3A35 35 weeks gestation of pregnancy: Secondary | ICD-10-CM

## 2021-02-19 NOTE — Progress Notes (Signed)
Pt has noticed increase in swelling in feet

## 2021-02-19 NOTE — Patient Instructions (Signed)
AREA PEDIATRIC/FAMILY PRACTICE PHYSICIANS  Central/[place]outheast [place] ([address]) [facility] Chambliss, MD; Eniola, MD; Hale, MD; Hensel, MD; McDiarmid, MD; McIntyer, MD; Neal, MD; Walden, MD [address] [place], [place] [address] [phone] Mon-Fri 8:30-12:30, 1:30-5:00 Providers come to see babies at [facility] Accepting Medicaid [facility] providers who accept newborns: Koirala, MD; Morrow, MD; Wolters, MD [address], [place], [place] [address] [phone] Mon-Fri 8:00-5:30 Babies seen by providers at [facility] Does NOT accept Medicaid Please call early in hospitalization for appointment (limited availability)  [facility] Mulberry, MD [address] [place], [place] [address] [phone] Mon, Tue, Thur, Fri 8:30-5:00, Wed 10:00-7:00 (closed 1-2pm) Babies seen by [facility] providers Accepting Medicaid Rubin - Pediatrician Rubin, MD [address], [place], [place] [address] [phone] Mon-Fri 8:30-5:00, Sat 8:30-12:00 Provider comes to see babies at [facility] Accepting Medicaid Must have been referred from current patients or contacted office prior to delivery [facility] ([facility]) Brown, MD; Chandler, MD; Ettefagh, MD; Grant, MD; Lester, MD; McCormick, MD; McQueen, MD; Prose, MD; Simha, MD; Stanley, MD; Stryffeler, NP; Tebben, NP [address], [place], [place] [address] [phone] Mon, Tue, Thur, Fri 8:30-5:30, Wed 9:30-5:30, Sat 8:30-12:30 Babies seen by [facility] providers Accepting Medicaid Only accepting infants of first-time parents or siblings of current patients Hospital discharge coordinator will make follow-up appointment Jack Amos [address], [place], [place]  [address] [phone]   Fax - [phone] [facility] [address], [place], [place]  [address] Phone - [phone]   Fax - [phone] Shilpa Gosrani [address], [place], [place]  [address] [phone]  East/[place]ortheast [place] ([address]) [facility] Bates, MD; Brassfield, MD; Cooper, Cox, MD; MD; Davis, MD; Dovico, MD; Ettefaugh, MD; Little, MD; Lowe, MD; Keiffer, MD; Melvin, MD; Sumner, MD; Williams, MD [address], [place], [place] [address] [phone] Mon-Fri 8:30-5:00 (extended evenings Mon-Thur as needed), Sat-Sun 10:00-1:00 Providers come to see babies at [facility] Accepting Medicaid for families of first-time babies and families with all children in the household age 3 and under. Must register with office prior to making appointment (M-F only). [facility] Henson, NP; Knapp, MD; Lalonde, MD; Tysinger, PA [address] [place], [place] [address] [phone] Mon-Fri 8:00-5:00 Babies seen by providers at [facility] Does NOT accept Medicaid/Commercial Insurance Only [facility] ([facility])  Artis, MD; Barnes, MD; Bratton, MD; Coccaro, MD; Lockett Gardner, MD; Kramer, MD; Marshall, MD; Netherton, MD; Poleto, MD; Skinner, MD [address] [place], [place] [address] [phone] Mon-Fri 8:30-5:30, Sat (Oct.-Mar.) 9:00-1:00 Babies seen by providers at [facility] Accepting Medicaid  [place] ([address]) [facility] Reid, MD; Warner, MD [address], [place], [place] [address] [phone] Mon-Fri 8:30-5:00, Sat 8:30-12:00 Providers come to see babies at [facility] Does NOT accept Medicaid [facility] Becker, PA; Hagler, MD; Scifres, PA; Sun, MD; Swayne, MD [address], [place],  [address] [phone] Mon-Fri 8:00-5:00 Babies seen by providers at [facility] Does NOT accept Medicaid Only accepting babies of parents who  are patients Please call early in hospitalization for appointment (limited availability) [facility] Clark, MD; Frye, MD; Kelleher, MD; Mack, NP; Miller, MD; O'Keller, MD; Patterson, NP; Pudlo, MD; Puzio, MD; Thomas, MD; Tucker, MD; Twiselton, MD [address], [place], [place] [address] [phone] Mon-Fri 8:00-5:00, Sat 9:00-12:00 Providers come to see babies at [facility] Does NOT accept Medicaid  [place] ([address]) [facility] providers accepting new patients: Brake, NP; Wharton, PA [address], [place], [place] [address] [phone] Mon-Fri 8:00-5:00 Babies seen by providers at [facility] Does NOT accept Medicaid Only accepting babies of parents who are patients Please call early in hospitalization for appointment (limited availability) [facility] Gay, MD; Quinlan, MD [address] [place], [place] [address] [phone] (press 1 to schedule appointment) Mon-Fri 8:00-5:00 Providers come to see babies at [facility] Does NOT accept Medicaid [facility] Mazer, MD [address] [place], [place] [address] [phone] Mon-Fri 8:30-5:00 (lunch 12:30-1:00), extended hours by appointment only Wed 5:00-6:30 Babies seen by [facility] providers Accepting Medicaid [facility] Banks, MD; Jordan, MD; Koberlein, MD [address], [place], [place] [address] [phone] Mon-Fri 8:00-5:00 Babies seen by [facility] providers Does NOT accept Medicaid [facility] Parker, MD; Hunter, MD; Wallace, DO [address] [place], [place] [address] [phone] Mon-Fri 8:00-5:00 Babies seen by [facility] providers Does NOT accept Medicaid [facility] Brandon, PA; Brecken, PA; Christy, NP; Dees, MD; DeClaire, MD; DeWeese, MD; Hansen, NP; Mills, NP; Parrish, NP; Smoot, NP; Summer, MD; Vapne,  MD [address] [place], [place] [address] [phone] Mon-Fri 8:30-5:00, Sat 10:00-1:00 Providers come to see babies at [facility] Does NOT accept Medicaid Free prenatal information session Tuesdays at 4:45pm [facility] Bouska, MD; Gordon, PA; Jeffery, PA; Weber, PA [address] [place] [address] [phone] Mon-Fri 7:30-5:30 Babies seen by [facility] providers [facility] [address], [place], [place]  [address] [phone]   Fax - [phone]  [place] ([address] & [address]) [facility] [place] [address] [place], [place] [address] [phone] Mon-Thur 8:00-6:00 Providers come to see babies at [facility] Accepting Medicaid [facility] Anderson, NP; Badger, MD; Beal, PA; Spencer, PA [address] [place], [place] [address] [phone] Mon-Thur 7:30-7:30, Fri 7:30-4:30 Babies seen by [facility] providers Accepting Medicaid [facility] Agbuya, MD; Klett, NP; Romgoolam, MD [address], [place], [place] [address] [phone] Mon-Fri 8:30-5:00, Sat 8:30-12:00 Providers come to see babies at [facility] Accepting Medicaid Must have "Meet & Greet" appointment at office prior to delivery [facility] ([facility]) McCord, MD; Wallace, MD; Wood, MD [address], [place], [place] [address] [phone] Mon-Wed 8:00-6:00, Thur-Fri 8:00-5:00, Sat 9:00-12:00 Providers come to see babies at [facility] Does NOT accept Medicaid Only accepting siblings of current patients [facility]  [address], [place], [place]  [address] [phone]   Fax - [phone] [facility] [address], [place], [place]  [address] [phone]   Fax -  [phone]  [place] ([address] & [address]) [facility] Cirigliano, DO; Matthews, DO [address] [place], [place] [address] [phone] Mon-Fri 7:00-5:00 Babies seen by [facility] providers Does NOT accept Medicaid [facility] Briscoe, MD; Howley, PA; Moreira, PA [address], [place], [place] [address] [phone] Mon-Fri 8:00-5:00 Babies seen by [facility] providers Accepting Medicaid [facility] Boyd, MD; Church, PA; Jones, NP; Osborn, PA [address], [place], [place] [address] [phone] Mon-Fri 8:00-5:00 Babies seen by providers at [facility] Accepting Medicaid  [place] ([address]) [facility] [place] Wendling, DO [address] [place], [place] [address] [phone] Mon-Fri 8:00-5:00 Babies seen by [facility] providers Does NOT accept Medicaid Limited availability, please call early in hospitalization to schedule follow-up [facility] Calderon, PA; Cummings, MD; Dillard, MD; Martin, PA; Olson, MD; VanDeven, PA [address], [place], [place] [address] [phone] Mon-Fri 8:30-5:00, Sat 9:00-12:00 Babies seen by providers at [facility] Accepting Medicaid Please register online then schedule online or call office [email or web address] Hunter, NP; Kumar, MD; Martin Rogers, PA [address], [place], [place] [address] [phone] Mon-Fri 8:00-5:00 Babies seen by providers at [facility] Accepting Medicaid [facility] ([facility]) Hemingway, MD; Kristi Fleenor, NP; West, MD [address], [place], [place] [address] [phone] Mon-Fri 8:00-5:30, Sat&Sun by appointment (phones open at  8:30) Babies seen by [facility] providers Accepting Medicaid Must be a first-time baby or sibling of current patient [facility] [place]  [address], [place], [place]  [address] [phone]   Fax - [phone]  [place] ([address]) [facility] Brown, PA; Cowen, PA; Rice, MD; Helton, PA; Spry, MD [address] [place], [place] [address] [phone] Mon-Thur 8:00-7:00, Fri 8:00-5:00, Sat 8:00-12:00, Sun 9:00-12:00 Babies seen by [facility] providers Accepting Medicaid [facility] Coe-Goins, MD; Marshall, MD; Pierre-Louis, MD [address] [place], [place], [place] [address] [phone] Mon-Thur 8:00-5:00 Babies seen by providers at [facility] Accepting Medicaid [facility] Bratton, MD; Coe-Goins, MD; Hayes, MD; Lewis, MD; List, MD; Lott, MD; Marshall, MD; Moran, MD; O'Neal, MD; Pierre-Louis, MD; Pitonzo, MD; Scholer, MD; Spangle, MD [address] [place], [place] [address] [phone] Mon-Fri 8:00-5:30, Sat (Oct.-Mar.) 9:00-1:00 Babies seen by providers at [facility] Accepting Medicaid Must fill out new patient packet, available online at [email or web address] [facility] ([facility]) Friddle, NP; Harris, NP; Kelly, NP; Logan, MD; Melvin, PA; Poth, MD; Ramadoss, MD; Stanton, NP [address], [place], [place] [address] [phone] Mon-Thur 8:00-5:30, Fri 8:00-5:00 Babies seen by providers at [facility] Accepting Medicaid  [place] ([address]) [facility] Dixon, PA; Blackshear, MD; Pickard, MD; Tapia, PA [address], [place], [place] [address] [phone] Mon-Fri 8:00-5:00 Babies seen by providers at [facility] Accepting Medicaid   [place] ([address]) [facility] [place] Masneri, DO; Meyers, MD; Nelson, PA [address], [place], [place] [address] [phone] Mon-Fri 8:00-5:00 Babies seen by providers at [facility] Does NOT accept Medicaid Limited appointment availability, please call early in hospitalization  [facility] [place] Kunedd, DO; McGowen, MD [address], [place], [place] [address] [phone] Mon-Fri 8:00-5:00 Babies seen by [facility] providers Does NOT accept Medicaid [facility] [place] Cameron, MD; MacDonald, MD; Michaels, PA; Nayak, MD [address] [place] [address], [place], [place] [address] [phone] Mon-Fri 8:00-5:00 After hours clinic ([address] [place],  [address]) [phone] Mon-Fri 5:00-8:00, Sat 12:00-6:00, Sun 10:00-4:00 Babies seen by [facility] providers Accepting Medicaid [facility] [place] [address], [place], [place]  [address] [phone]   Fax - [phone]  [place] ([address]) [facility] [place] [facility] Andy, MD [address], [place], [place] [address] [phone] Mon-Fri 8:00-5:00 Babies seen by [facility] providers Does NOT accept Medicaid [facility] [place] ([facility] [place]) Eksir, MD [address], [place], [place] [address] [phone] Mon-Thur 8:00-7:00, Fri 8:00-5:00, Sat 8:00-12:00 Babies seen by providers at [facility] Accepting Medicaid - but does not have vaccinations in office (must be received elsewhere) Limited availability, please call early in hospitalization  [place] ([address]) [facility]  Charlene Flemming, MD [address], [place] [address] [phone]  Fax [phone]  [facility]  Kimberly Newton, MD, Annamarie Streilein, PA, Carla Hampton, PA [address] [place] [place], [place]  [address] [phone] [facility]  [address], [place], [place] [address] [phone] [address], [place], [place] [address] [phone] (West Office)  [facility] [address], [place], [place] [address] [phone] [facility] [address] [place], [place], [place] [address] [phone] [facility] [address], [place], [place] [address] [phone] [facility] [address], [place], [place] [address] [phone] [facility] [address], [place], [place] [address] [phone] [facility] [address], [place], [place] [address] [phone] [facility]  [address], [place], [place] [address] [phone] Dr[facility] [address] [place] [address], [place], [place] [address] [phone] [facility] [address], [place], [place] [address] [phone] [facility] [address], [place],  [address] [phone]  

## 2021-02-19 NOTE — Progress Notes (Signed)
   PRENATAL VISIT NOTE  Subjective:  Brandi Murray is a 35 y.o. G3P0020 at [redacted]w[redacted]d being seen today for ongoing prenatal care.  She is currently monitored for the following issues for this low-risk pregnancy and has Vitamin D insufficiency and Supervision of other normal pregnancy, antepartum on their problem list.  Patient reports no complaints.  Contractions: Not present. Vag. Bleeding: None.  Movement: Present. Denies leaking of fluid.   The following portions of the patient's history were reviewed and updated as appropriate: allergies, current medications, past family history, past medical history, past social history, past surgical history and problem list.   Objective:   Vitals:   02/19/21 1358  BP: 119/76  Pulse: 78  Weight: 173 lb (78.5 kg)    Fetal Status: Fetal Heart Rate (bpm): 145 Fundal Height: 35 cm Movement: Present     General:  Alert, oriented and cooperative. Patient is in no acute distress.  Skin: Skin is warm and dry. No rash noted.   Cardiovascular: Normal heart rate noted  Respiratory: Normal respiratory effort, no problems with respiration noted  Abdomen: Soft, gravid, appropriate for gestational age.  Pain/Pressure: Present     Pelvic: Cervical exam deferred        Extremities: Normal range of motion.  Edema: Mild pitting, slight indentation  Mental Status: Normal mood and affect. Normal behavior. Normal judgment and thought content.   Assessment and Plan:  Pregnancy: G3P0020 at [redacted]w[redacted]d 1. Supervision of other normal pregnancy, antepartum - Peds list given  - Would like to defer flu vaccine to next visit  - Continued LE edema noted, normotensive, patient states improvement with elevation and rest  2. [redacted] weeks gestation of pregnancy  Preterm labor symptoms and general obstetric precautions including but not limited to vaginal bleeding, contractions, leaking of fluid and fetal movement were reviewed in detail with the patient. Please refer to After Visit  Summary for other counseling recommendations.   Return in about 1 week (around 02/26/2021) for LOB, In-Person.  No future appointments.  Vonzella Nipple, PA-C

## 2021-02-26 ENCOUNTER — Other Ambulatory Visit (HOSPITAL_COMMUNITY)
Admission: RE | Admit: 2021-02-26 | Discharge: 2021-02-26 | Disposition: A | Discharge status: Home / Self Care | Payer: BC Managed Care – PPO | Source: Ambulatory Visit | Attending: Advanced Practice Midwife | Admitting: Advanced Practice Midwife

## 2021-02-26 ENCOUNTER — Other Ambulatory Visit: Payer: Self-pay

## 2021-02-26 ENCOUNTER — Ambulatory Visit (INDEPENDENT_AMBULATORY_CARE_PROVIDER_SITE_OTHER): Payer: BC Managed Care – PPO | Admitting: Advanced Practice Midwife

## 2021-02-26 VITALS — BP 110/71 | HR 93 | Wt 173.0 lb

## 2021-02-26 DIAGNOSIS — O368130 Decreased fetal movements, third trimester, not applicable or unspecified: Secondary | ICD-10-CM

## 2021-02-26 DIAGNOSIS — Z3480 Encounter for supervision of other normal pregnancy, unspecified trimester: Secondary | ICD-10-CM | POA: Diagnosis not present

## 2021-02-26 DIAGNOSIS — Z23 Encounter for immunization: Secondary | ICD-10-CM

## 2021-02-26 DIAGNOSIS — Z3A36 36 weeks gestation of pregnancy: Secondary | ICD-10-CM | POA: Diagnosis not present

## 2021-02-26 DIAGNOSIS — O1203 Gestational edema, third trimester: Secondary | ICD-10-CM

## 2021-02-26 LAB — OB RESULTS CONSOLE GBS: GBS: NEGATIVE

## 2021-02-26 NOTE — Progress Notes (Signed)
   PRENATAL VISIT NOTE  Subjective:  Brandi Murray is a 35 y.o. G3P0020 at [redacted]w[redacted]d being seen today at [facility] for ongoing prenatal care.  She is currently monitored for the following issues for this low-risk pregnancy and has Vitamin D insufficiency and Supervision of other normal pregnancy, antepartum on their problem list.  Patient reports  swelling and decreased fetal movement x 24 hours .  Contractions: Not present. Vag. Bleeding: None.  Movement: Present. Denies leaking of fluid.   The following portions of the patient's history were reviewed and updated as appropriate: allergies, current medications, past family history, past medical history, past social history, past surgical history and problem list.   Objective:   Vitals:   02/26/21 0808  BP: 110/71  Pulse: 93  Weight: 173 lb (78.5 kg)    Fetal Status: Fetal Heart Rate (bpm): 143 Fundal Height: 36 cm Movement: Present  Presentation: Vertex  General:  Alert, oriented and cooperative. Patient is in no acute distress.  Skin: Skin is warm and dry. No rash noted.   Cardiovascular: Normal heart rate noted  Respiratory: Normal respiratory effort, no problems with respiration noted  Abdomen: Soft, gravid, appropriate for gestational age.  Pain/Pressure: Present     Pelvic: Cervical exam performed in the presence of a chaperone Dilation: Closed Effacement (%): 70 Station: -2  Extremities: Normal range of motion.  Edema: Mild pitting, slight indentation  Mental Status: Normal mood and affect. Normal behavior. Normal judgment and thought content.   Assessment and Plan:  Pregnancy: G3P0020 at [redacted]w[redacted]d 1. Supervision of other normal pregnancy, antepartum --Anticipatory guidance about next visits/weeks of pregnancy given. --Cervix closed, soft and effaced 70%, posterior, vertex position confirmed by [place] --Flu shot given today  --Next appt in 1 week  - Culture, beta strep (group b only) - Cervicovaginal ancillary only(  [place])  2. [redacted] weeks gestation of pregnancy   3. Decreased fetal movements in third trimester, single or unspecified fetus --Pt reports feeling less movement than usual since  yesterday --NST reactive today, pt feeling more movement in office --Kick counts/reasons to seek care reviewed  4. Edema during pregnancy in third trimester --BLE, pt reports sitting mostly for work.   --Elevate, compression --BP grossly wnl today, no s/sx of PEC  Preterm  labor symptoms and general obstetric precautions including but not limited to vaginal bleeding, contractions, leaking of fluid and fetal movement were reviewed in detail with the patient. Please refer to After Visit Summary for other counseling recommendations.   Return in about 1 week (around 03/05/2021).  Future Appointments  Date Time Provider Department Center  03/05/2021  9:50 AM Rasch, Harolyn Rutherford, NP [facility]  03/12/2021  8:30 AM Leftwich-Kirby, Wilmer Floor, CNM [facility]  03/19/2021  9:30 AM Rasch, Harolyn Rutherford, NP [facility]    Sharen Counter, CNM

## 2021-02-26 NOTE — Patient Instructions (Signed)
Things to Try After 37 weeks to Encourage Labor/Get Ready for Labor:    Try the Miles Circuit at www.milescircuit.com daily to improve baby's position and encourage the onset of labor.  Walk a little and rest a little every day.  Change positions often.  Cervical Ripening: May try one or both Red Raspberry Leaf capsules or tea:  two 300mg or 400mg tablets with each meal, 2-3 times a day, or 1-3 cups of tea daily  Potential Side Effects Of Raspberry Leaf:  Most women do not experience any side effects from drinking raspberry leaf tea. However, nausea and loose stools are possible   Evening Primrose Oil capsules: take 1 capsule by mouth and place one capsule in the vagina every night.    Some of the potential side effects:  Upset stomach  Loose stools or diarrhea  Headaches  Nausea  Sex can also help the cervix ripen and encourage labor onset.    Labor Precautions Reasons to come to [facility] at [facility]:  1.  Contractions are  5 minutes apart or less, each last 1 minute, these have been going on for 1-2 hours, and you cannot walk or talk during them 2.  You have a large gush of fluid, or a trickle of fluid that will not stop and you have to wear a pad 3.  You have bleeding that is bright red, heavier than spotting--like menstrual bleeding (spotting can be normal in early labor or after a check of your cervix) 4.  You do not feel the baby moving like he/she normally does  

## 2021-02-27 LAB — CERVICOVAGINAL ANCILLARY ONLY
Chlamydia: NEGATIVE
Comment: NEGATIVE
Comment: NORMAL
Neisseria Gonorrhea: NEGATIVE

## 2021-03-01 LAB — CULTURE, BETA STREP (GROUP B ONLY)
MICRO NUMBER:: 12581342
SPECIMEN QUALITY:: ADEQUATE

## 2021-03-05 ENCOUNTER — Ambulatory Visit (INDEPENDENT_AMBULATORY_CARE_PROVIDER_SITE_OTHER): Payer: BC Managed Care – PPO | Admitting: Obstetrics and Gynecology

## 2021-03-05 ENCOUNTER — Other Ambulatory Visit: Payer: Self-pay

## 2021-03-05 VITALS — BP 118/75 | HR 76 | Wt 173.0 lb

## 2021-03-05 DIAGNOSIS — Z3480 Encounter for supervision of other normal pregnancy, unspecified trimester: Secondary | ICD-10-CM

## 2021-03-05 NOTE — Progress Notes (Signed)
   PRENATAL VISIT NOTE  Subjective:  Brandi Murray is a 35 y.o. G3P0020 at [redacted]w[redacted]d being seen today for ongoing prenatal care.  She is currently monitored for the following issues for this low-risk pregnancy and has Vitamin D insufficiency and Supervision of other normal pregnancy, antepartum on their problem list.  Patient reports no complaints.  Contractions: Irritability. Vag. Bleeding: None.  Movement: Present. Denies leaking of fluid.   The following portions of the patient's history were reviewed and updated as appropriate: allergies, current medications, past family history, past medical history, past social history, past surgical history and problem list.   Objective:   Vitals:   03/05/21 0955  BP: 118/75  Pulse: 76  Weight: 173 lb (78.5 kg)    Fetal Status: Fetal Heart Rate (bpm): 141 Fundal Height: 37 cm Movement: Present  Presentation: Undeterminable  General:  Alert, oriented and cooperative. Patient is in no acute distress.  Skin: Skin is warm and dry. No rash noted.   Cardiovascular: Normal heart rate noted  Respiratory: Normal respiratory effort, no problems with respiration noted  Abdomen: Soft, gravid, appropriate for gestational age.  Pain/Pressure: Present     Pelvic: Cervical exam performed in the presence of a chaperone        Extremities: Normal range of motion.  Edema: Mild pitting, slight indentation  Mental Status: Normal mood and affect. Normal behavior. Normal judgment and thought content.   Assessment and Plan:  Pregnancy: G3P0020 at [redacted]w[redacted]d  1. Supervision of other normal pregnancy, antepartum  Unable to check patient's cervix d/t posterior cervix.  GBS negative  Declines TDAP  Term labor symptoms and general obstetric precautions including but not limited to vaginal bleeding, contractions, leaking of fluid and fetal movement were reviewed in detail with the patient. Please refer to After Visit Summary for other counseling recommendations.   No  follow-ups on file.  Future Appointments  Date Time Provider Department Center  03/12/2021  8:30 AM Kendell Bane [facility]  03/19/2021  9:30 AM Iridiana Fonner, Harolyn Rutherford, NP [facility]    Venia Carbon, NP

## 2021-03-09 LAB — OB RESULTS CONSOLE GBS: GBS: NEGATIVE

## 2021-03-11 ENCOUNTER — Ambulatory Visit (INDEPENDENT_AMBULATORY_CARE_PROVIDER_SITE_OTHER): Payer: BC Managed Care – PPO | Admitting: Obstetrics & Gynecology

## 2021-03-11 ENCOUNTER — Other Ambulatory Visit: Payer: Self-pay

## 2021-03-11 VITALS — BP 127/63 | HR 69 | Wt 175.0 lb

## 2021-03-11 DIAGNOSIS — O26893 Other specified pregnancy related conditions, third trimester: Secondary | ICD-10-CM | POA: Diagnosis not present

## 2021-03-11 DIAGNOSIS — Z3A38 38 weeks gestation of pregnancy: Secondary | ICD-10-CM

## 2021-03-11 DIAGNOSIS — O479 False labor, unspecified: Secondary | ICD-10-CM

## 2021-03-11 DIAGNOSIS — Z3480 Encounter for supervision of other normal pregnancy, unspecified trimester: Secondary | ICD-10-CM

## 2021-03-11 NOTE — Progress Notes (Signed)
Pt has noticed increase in discharge- watery/mucous

## 2021-03-11 NOTE — Progress Notes (Signed)
   PRENATAL VISIT NOTE  Subjective:  Brandi Murray is a 35 y.o. G3P0020 at [redacted]w[redacted]d being seen today for ongoing prenatal care.  She is currently monitored for the following issues for this low-risk pregnancy and has Vitamin D insufficiency and Supervision of other normal pregnancy, antepartum on their problem list.  Patient reports  vaginal spotting , cramping   .  .   . Denies leaking of fluid.   The following portions of the patient's history were reviewed and updated as appropriate: allergies, current medications, past family history, past medical history, past social history, past surgical history and problem list.   Objective:  There were no vitals filed for this visit.  Fetal Status:           General:  Alert, oriented and cooperative. Patient is in no acute distress.  Skin: Skin is warm and dry. No rash noted.   Cardiovascular: Normal heart rate noted  Respiratory: Normal respiratory effort, no problems with respiration noted  Abdomen: Soft, gravid, appropriate for gestational age.        Pelvic: Cervical exam performed in the presence of a chaperone      2/70/-2/vertex  Extremities: Normal range of motion.     Mental Status: Normal mood and affect. Normal behavior. Normal judgment and thought content.   Assessment and Plan:  Pregnancy: G3P0020 at [redacted]w[redacted]d 1. Supervision of other normal pregnancy, antepartum Cramping/contractions; possibly early labor.  Cervix 2/70; regualr painful contractions not present.    Baseline:  130 Accelerations: present Decelerations: absent Variability: moderate Interpretation:  Reactive NST    Term labor symptoms and general obstetric precautions including but not limited to vaginal bleeding, contractions, leaking of fluid and fetal movement were reviewed in detail with the patient. Please refer to After Visit Summary for other counseling recommendations.   Patient has routine OB scheduled tomorrow which we will cancel since she had a full visit  today.  Elsie Lincoln, MD

## 2021-03-12 ENCOUNTER — Other Ambulatory Visit: Payer: Self-pay

## 2021-03-12 ENCOUNTER — Inpatient Hospital Stay (HOSPITAL_COMMUNITY)
Admission: AD | Admit: 2021-03-12 | Discharge: 2021-03-14 | DRG: 798 | Disposition: A | Discharge status: Home / Self Care | Payer: BC Managed Care – PPO | Attending: Family Medicine | Admitting: Family Medicine

## 2021-03-12 ENCOUNTER — Encounter (HOSPITAL_COMMUNITY): Payer: Self-pay | Admitting: Obstetrics and Gynecology

## 2021-03-12 ENCOUNTER — Encounter: Payer: BC Managed Care – PPO | Admitting: Advanced Practice Midwife

## 2021-03-12 DIAGNOSIS — Z3A38 38 weeks gestation of pregnancy: Secondary | ICD-10-CM | POA: Diagnosis not present

## 2021-03-12 DIAGNOSIS — O43123 Velamentous insertion of umbilical cord, third trimester: Secondary | ICD-10-CM | POA: Diagnosis not present

## 2021-03-12 DIAGNOSIS — Z23 Encounter for immunization: Secondary | ICD-10-CM | POA: Diagnosis not present

## 2021-03-12 DIAGNOSIS — O4593 Premature separation of placenta, unspecified, third trimester: Principal | ICD-10-CM | POA: Diagnosis present

## 2021-03-12 DIAGNOSIS — Z20822 Contact with and (suspected) exposure to covid-19: Secondary | ICD-10-CM | POA: Diagnosis not present

## 2021-03-12 DIAGNOSIS — O26893 Other specified pregnancy related conditions, third trimester: Secondary | ICD-10-CM | POA: Diagnosis not present

## 2021-03-12 LAB — RESP PANEL BY RT-PCR (FLU A&B, COVID) ARPGX2
Influenza A by PCR: NEGATIVE
Influenza B by PCR: NEGATIVE
SARS Coronavirus 2 by RT PCR: NEGATIVE

## 2021-03-12 LAB — TYPE AND SCREEN
ABO/RH(D): O POS
Antibody Screen: NEGATIVE

## 2021-03-12 LAB — CBC
HCT: 38.6 % (ref 36.0–46.0)
Hemoglobin: 13 g/dL (ref 12.0–15.0)
MCH: 32.7 pg (ref 26.0–34.0)
MCHC: 33.7 g/dL (ref 30.0–36.0)
MCV: 97 fL (ref 80.0–100.0)
Platelets: 208 10*3/uL (ref 150–400)
RBC: 3.98 MIL/uL (ref 3.87–5.11)
RDW: 13.9 % (ref 11.5–15.5)
WBC: 9.9 10*3/uL (ref 4.0–10.5)
nRBC: 0 % (ref 0.0–0.2)

## 2021-03-12 LAB — RPR: RPR Ser Ql: NONREACTIVE

## 2021-03-12 MED ORDER — OXYCODONE-ACETAMINOPHEN 5-325 MG PO TABS: 2.0000 | ORAL_TABLET | ORAL | Status: DC | PRN

## 2021-03-12 MED ORDER — DIPHENHYDRAMINE HCL 25 MG PO CAPS: 25.0000 mg | ORAL_CAPSULE | Freq: Four times a day (QID) | ORAL | Status: DC | PRN

## 2021-03-12 MED ORDER — SIMETHICONE 80 MG PO CHEW: 80.0000 mg | CHEWABLE_TABLET | ORAL | Status: DC | PRN

## 2021-03-12 MED ORDER — FLEET ENEMA 7-19 GM/118ML RE ENEM: 1.0000 | ENEMA | Freq: Every day | RECTAL | Status: DC | PRN

## 2021-03-12 MED ORDER — METHYLERGONOVINE MALEATE 0.2 MG PO TABS: 0.2000 mg | ORAL_TABLET | ORAL | Status: DC | PRN

## 2021-03-12 MED ORDER — HYDROXYZINE HCL 50 MG PO TABS: 50.0000 mg | ORAL_TABLET | Freq: Four times a day (QID) | ORAL | Status: DC | PRN

## 2021-03-12 MED ORDER — PRENATAL MULTIVITAMIN CH
1.0000 | ORAL_TABLET | Freq: Every day | ORAL | Status: DC
  Administered 2021-03-13: 1 via ORAL
  Filled 2021-03-12: qty 1

## 2021-03-12 MED ORDER — OXYTOCIN-SODIUM CHLORIDE 30-0.9 UT/500ML-% IV SOLN
INTRAVENOUS | Status: AC
  Filled 2021-03-12: qty 500

## 2021-03-12 MED ORDER — LIDOCAINE HCL (PF) 1 % IJ SOLN
30.0000 mL | INTRAMUSCULAR | Status: AC | PRN
  Administered 2021-03-12: 30 mL via SUBCUTANEOUS
  Filled 2021-03-12: qty 30

## 2021-03-12 MED ORDER — LACTATED RINGERS IV SOLN: INTRAVENOUS | Status: DC

## 2021-03-12 MED ORDER — FERROUS SULFATE 325 (65 FE) MG PO TABS: 325.0000 mg | ORAL_TABLET | ORAL | Status: DC

## 2021-03-12 MED ORDER — FENTANYL CITRATE (PF) 100 MCG/2ML IJ SOLN
50.0000 ug | INTRAMUSCULAR | Status: DC | PRN
  Administered 2021-03-12 (×2): 100 ug via INTRAVENOUS
  Filled 2021-03-12 (×2): qty 2

## 2021-03-12 MED ORDER — MEASLES, MUMPS & RUBELLA VAC IJ SOLR: 0.5000 mL | Freq: Once | INTRAMUSCULAR | Status: DC

## 2021-03-12 MED ORDER — BISACODYL 10 MG RE SUPP: 10.0000 mg | Freq: Every day | RECTAL | Status: DC | PRN

## 2021-03-12 MED ORDER — TETANUS-DIPHTH-ACELL PERTUSSIS 5-2.5-18.5 LF-MCG/0.5 IM SUSY
0.5000 mL | PREFILLED_SYRINGE | Freq: Once | INTRAMUSCULAR | Status: AC
  Administered 2021-03-13: 0.5 mL via INTRAMUSCULAR
  Filled 2021-03-12: qty 0.5

## 2021-03-12 MED ORDER — METHYLERGONOVINE MALEATE 0.2 MG/ML IJ SOLN: 0.2000 mg | INTRAMUSCULAR | Status: DC | PRN

## 2021-03-12 MED ORDER — ACETAMINOPHEN 325 MG PO TABS: 650.0000 mg | ORAL_TABLET | ORAL | Status: DC | PRN

## 2021-03-12 MED ORDER — ONDANSETRON HCL 4 MG/2ML IJ SOLN: 4.0000 mg | INTRAMUSCULAR | Status: DC | PRN

## 2021-03-12 MED ORDER — OXYTOCIN-SODIUM CHLORIDE 30-0.9 UT/500ML-% IV SOLN
2.5000 [IU]/h | INTRAVENOUS | Status: DC
  Administered 2021-03-12: 2.5 [IU]/h via INTRAVENOUS

## 2021-03-12 MED ORDER — DOCUSATE SODIUM 100 MG PO CAPS
100.0000 mg | ORAL_CAPSULE | Freq: Two times a day (BID) | ORAL | Status: DC
  Administered 2021-03-12 – 2021-03-13 (×3): 100 mg via ORAL
  Filled 2021-03-12 (×3): qty 1

## 2021-03-12 MED ORDER — IBUPROFEN 600 MG PO TABS
600.0000 mg | ORAL_TABLET | Freq: Four times a day (QID) | ORAL | Status: DC
  Filled 2021-03-12 (×4): qty 1

## 2021-03-12 MED ORDER — LACTATED RINGERS IV SOLN
500.0000 mL | INTRAVENOUS | Status: DC | PRN
  Administered 2021-03-12: 1000 mL via INTRAVENOUS

## 2021-03-12 MED ORDER — MEDROXYPROGESTERONE ACETATE 150 MG/ML IM SUSP: 150.0000 mg | INTRAMUSCULAR | Status: DC | PRN

## 2021-03-12 MED ORDER — SODIUM CHLORIDE 0.9 % IV SOLN
2.0000 g | Freq: Once | INTRAVENOUS | Status: AC
  Administered 2021-03-12: 2 g via INTRAVENOUS
  Filled 2021-03-12: qty 2

## 2021-03-12 MED ORDER — ONDANSETRON HCL 4 MG PO TABS: 4.0000 mg | ORAL_TABLET | ORAL | Status: DC | PRN

## 2021-03-12 MED ORDER — ONDANSETRON HCL 4 MG/2ML IJ SOLN: 4.0000 mg | Freq: Four times a day (QID) | INTRAMUSCULAR | Status: DC | PRN

## 2021-03-12 MED ORDER — DIBUCAINE (PERIANAL) 1 % EX OINT: 1.0000 "application " | TOPICAL_OINTMENT | CUTANEOUS | Status: DC | PRN

## 2021-03-12 MED ORDER — SOD CITRATE-CITRIC ACID 500-334 MG/5ML PO SOLN: 30.0000 mL | ORAL | Status: DC | PRN

## 2021-03-12 MED ORDER — COCONUT OIL OIL: 1.0000 "application " | TOPICAL_OIL | Status: DC | PRN

## 2021-03-12 MED ORDER — FENTANYL CITRATE (PF) 100 MCG/2ML IJ SOLN: 100.0000 ug | INTRAMUSCULAR | Status: DC | PRN

## 2021-03-12 MED ORDER — OXYCODONE-ACETAMINOPHEN 5-325 MG PO TABS: 1.0000 | ORAL_TABLET | ORAL | Status: DC | PRN

## 2021-03-12 MED ORDER — OXYTOCIN BOLUS FROM INFUSION
333.0000 mL | Freq: Once | INTRAVENOUS | Status: AC
  Administered 2021-03-12: 333 mL via INTRAVENOUS

## 2021-03-12 MED ORDER — FLEET ENEMA 7-19 GM/118ML RE ENEM: 1.0000 | ENEMA | RECTAL | Status: DC | PRN

## 2021-03-12 MED ORDER — WITCH HAZEL-GLYCERIN EX PADS: 1.0000 "application " | MEDICATED_PAD | CUTANEOUS | Status: DC | PRN

## 2021-03-12 MED ORDER — BENZOCAINE-MENTHOL 20-0.5 % EX AERO: 1.0000 "application " | INHALATION_SPRAY | CUTANEOUS | Status: DC | PRN

## 2021-03-12 NOTE — Lactation Note (Signed)
This note was copied from a baby's chart. Lactation Consultation Note  Patient Name: Brandi Murray TIWPY'K Date: 03/12/2021 Reason for consult: Initial assessment Age:34 hours  Initial visit to 13 hours old infant. Infant is attempting latch upon arrival. Mother states infant has difficulty latching to left breast. Assisted with latch, observed a few uncoordinated sucks, no swallows, mother's nipple is compressed. Noted thick tongue.   Several attempts to latch infant unsuccessful. LC assisted with hand expression, collected ~3 mL of EBM and student spoonfed. Mother has been using her personal pump (Spectra) and talked about frequency/care of parts/milk storage.  Feeding plan:  1-Aim for a deep, comfortable latch, feed on demand or 8-12 times in 24h period. 2-Pump or hand-express and offer EBM, as needed 3-Encouraged maternal rest, hydration and food intake.   Contact LC as needed for feeds/support/concerns/questions, All questions answered. Reviewed InJoy booklet and LC brochure.     Maternal Data Has patient been taught Hand Expression?: Yes Does the patient have breastfeeding experience prior to this delivery?: No  Feeding Mother's Current Feeding Choice: Breast Milk  LATCH Score Latch: Repeated attempts needed to sustain latch, nipple held in mouth throughout feeding, stimulation needed to elicit sucking reflex.  Audible Swallowing: None  Type of Nipple: Everted at rest and after stimulation  Comfort (Breast/Nipple): Soft / non-tender  Hold (Positioning): Assistance needed to correctly position infant at breast and maintain latch.  LATCH Score: 6   Lactation Tools Discussed/Used Tools: Pump Breast pump type: Other (comment) (personal pump (Spectra)) Pump Education: Milk Storage;Setup, frequency, and cleaning Reason for Pumping: supplementation Pumping frequency: as needed  Interventions Interventions: Breast feeding basics reviewed;Assisted with latch;Skin to  skin;Breast massage;Hand express;Adjust position;Breast compression;Expressed milk;Position options;Support pillows;Education;LC Services brochure  Discharge Pump: Personal  Consult Status Consult Status: Follow-up Date: 03/13/21 Follow-up type: In-patient    Brandi Murray A Brandi Murray 03/12/2021, 9:38 PM

## 2021-03-12 NOTE — MAU Note (Signed)
Ctxs since 72mn. Water broke about 0030 which was clear with alittle pink streaks. Good FM. Had sve Mon and was 2 cm.

## 2021-03-12 NOTE — H&P (Signed)
Brandi Murray is a 35 y.o. female G3P0020 with IUP at [redacted]w[redacted]d by [place] presenting for ctx that started around midnight w/ROM around 12:30. She reports positive fetal movement.  Prenatal History/Complications: PNC at [facility] Pregnancy complications:  - Past Medical History: Past Medical History:  Diagnosis Date   Medical history non-contributory    No known health problems     Past Surgical History: Past Surgical History:  Procedure Laterality Date   NO PAST SURGERIES     WISDOM TOOTH EXTRACTION      Obstetrical History: OB History     Gravida  3   Para  0   Term  0   Preterm  0   AB  2   Living  0      SAB  2   IAB  0   Ectopic  0   Multiple  0   Live Births  0            Social History: Social History   Socioeconomic History   Marital status: Married    Spouse name: Not on file   Number of children: 0   Years of education: Not on file   Highest education level: Not on file  Occupational History   Not on file  Tobacco Use   Smoking status: Never    Passive exposure: Never   Smokeless tobacco: Never  Vaping Use   Vaping Use: Never used  Substance and Sexual Activity   Alcohol use: Not Currently    Comment: social   Drug use: No   Sexual activity: Yes    Partners: Male    Birth control/protection: None  Other Topics Concern   Not on file  Social History Narrative   Not on file   Social Determinants of Health   Financial Resource Strain: Not on file  Food Insecurity: Not on file  Transportation Needs: Not on file  Physical Activity: Not on file  Stress: Not on file  Social Connections: Not on file    Family History: Family History  Problem Relation Age of Onset   Stroke Father    Epilepsy Father    Cancer Neg Hx    Heart disease Neg Hx     Allergies: No Known Allergies  Medications Prior to Admission  Medication Sig Dispense Refill Last Dose   Prenatal Vit-Fe Fumarate-FA (MULTIVITAMIN-PRENATAL) 27-0.8 MG TABS tablet  Take 1 tablet by mouth daily at 12 noon.       Review of Systems   Constitutional: Negative for fever and chills Eyes: Negative for visual disturbances Respiratory: Negative for shortness of breath, dyspnea Cardiovascular: Negative for chest pain or palpitations  Gastrointestinal: Negative for vomiting, diarrhea and constipation.  POSITIVE for abdominal pain (contractions) Genitourinary: Negative for dysuria and urgency Musculoskeletal: Negative for back pain, joint pain, myalgias  Neurological: Negative for dizziness and headaches  Blood pressure 124/77, pulse 73, temperature 98.7 F (37.1 C), resp. rate 16, height 5\' 6"  (1.676 m), weight 80.7 kg, last menstrual period 06/15/2020, SpO2 100 %. General appearance: alert, cooperative, and no distress Lungs: normal respiratory effort Heart: regular rate and rhythm Abdomen: soft, non-tender; bowel sounds normal Extremities: Homans sign is negative, no sign of DVT DTR's 2+ Presentation: cephalic Fetal monitoring  Baseline: 140 bpm, Variability: Good {> 6 bpm), Accelerations: Reactive, and Decelerations: Absent Uterine activity  2-4 Dilation: Lip/rim Effacement (%): 80 Station: 0 Exam by:: [email or web address] rNC  AROM of forebag   Prenatal labs: ABO, Rh: --/--/PENDING (11/15  0345) Antibody: PENDING (11/15 0345) Rubella: 6.82 (04/28 1022) RPR: NON-REACTIVE (09/07 0841)  HBsAg: Negative (04/28 1022)  HIV: NON-REACTIVE (09/07 0841)  GBS: Negative/-- (11/12 0000)   Nursing Staff Provider  Office Location [facility]  Dating  LMP c/w 1st tri [place]  Language  English  Anatomy US  Normal   Flu Vaccine  02/26/21 Genetic Screen  NIPS: low risk AFP:  normal  Horizons: neg  TDaP vaccine   Declined  Hgb A1C or  GTT Early  Third trimester: Normal 72, 120, 101  Rhogam   N/A   LAB RESULTS   Feeding Plan breast Blood Type O/Positive/-- (04/28 1022)   Contraception Declined Antibody Negative (04/28 1022)  Circumcision girl Rubella 6.82 (04/28 1022)   Pediatrician  List given RPR Non Reactive (04/28 1022)   Support Person Maurine Minister- husband  HBsAg Negative (04/28 1022)   Prenatal Classes Discussed  HIV Non Reactive (04/28 1022)  BTL Consent N/A GBS  negative  VBAC Consent N/A Pap 04/26/2020 Normal                    Waterbirth  [ ]  Class [ ]  Consent [ ]  CNM visit   Prenatal Transfer Tool  Maternal Diabetes: No Genetic Screening: Normal Maternal Ultrasounds/Referrals: Normal Fetal Ultrasounds or other Referrals:  None Maternal Substance Abuse:  No Significant Maternal Medications:  None Significant Maternal Lab Results: Group B Strep negative  Results for orders placed or performed during the hospital encounter of 03/12/21 (from the past 24 hour(s))  CBC   Collection Time: 03/12/21  3:44 AM  Result Value Ref Range   WBC 9.9 4.0 - 10.5 K/uL   RBC 3.98 3.87 - 5.11 MIL/uL   Hemoglobin 13.0 12.0 - 15.0 g/dL   HCT 03/14/21 - 03/14/21 %   MCV 97.0 80.0 - 100.0 fL   MCH 32.7 26.0 - 34.0 pg   MCHC 33.7 30.0 - 36.0 g/dL   RDW 14.2 39.5 - 32.0 %   Platelets 208 150 - 400 K/uL   nRBC 0.0 0.0 - 0.2 %  Type and screen [facility]   Collection Time: 03/12/21  3:45 AM  Result Value Ref Range   ABO/RH(D) PENDING    Antibody Screen PENDING    Sample Expiration      03/15/2021,2359 Performed at [facility], [address], [place], [facility] [address]     Assessment: GRETEL CANTU is a 35 y.o. [phone] with an IUP at [redacted]w[redacted]d presenting for active labor, ROM  Plan: #Labor: expectant management #Pain:  Per request #FWB Cat 1   20 03/12/2021, 4:20 AM

## 2021-03-12 NOTE — Progress Notes (Signed)
Peri care with new pad. Fld is pink tinged

## 2021-03-12 NOTE — Discharge Summary (Addendum)
Postpartum Discharge Summary  Date of Service updated 03/13/21     Patient Name: Brandi Murray DOB: [birth year] MRN: [id number]  Date of admission: 03/12/2021 Delivery date:03/12/2021  Delivering provider: Christin Fudge  Date of discharge: 03/13/2021  Admitting diagnosis: Indication for care in labor or delivery [O75.9] Intrauterine pregnancy: [redacted]w[redacted]d     Secondary diagnosis:  Active Problems:   Indication for care in labor or delivery  Additional problems: n/a    Discharge diagnosis: Term Pregnancy Delivered                                              Post partum procedures: none Augmentation: AROM Complications: None  Hospital course: Onset of Labor With Vaginal Delivery      35 y.o. yo G3P0020 at [redacted]w[redacted]d was admitted in Active Labor on 03/12/2021. Patient had an uncomplicated labor course as follows:  Membrane Rupture Time/Date: 4:18 AM ,03/12/2021   Delivery Method:Vaginal, Spontaneous  Episiotomy: None  Lacerations:  2nd degree  Patient had an uncomplicated postpartum course.  She is ambulating, tolerating a regular diet, passing flatus, and urinating well. Patient is discharged home in stable condition on 03/13/21.  Newborn Data: Birth date:03/12/2021  Birth time:8:15 AM  Gender:Female  Living status:Living  Apgars:8 ,9  Weight:3300 g   Magnesium Sulfate received: No BMZ received: No Rhophylac:N/A MMR:N/A T-DaP: declined Flu: Yes Transfusion:No  Physical exam  Vitals:   03/12/21 1537 03/12/21 1952 03/12/21 2326 03/13/21 0521  BP: 134/80 134/83 118/74 116/79  Pulse: 78 84 80 84  Resp: $Remo'16 16 17 16  'KuTtS$ Temp: 98.4 F (36.9 C) 98.9 F (37.2 C) 98.9 F (37.2 C) 98.4 F (36.9 C)  TempSrc: Oral Oral Oral Oral  SpO2: 99% 99% 96% 98%  Weight:      Height:       General: alert, cooperative, and no distress Lochia: appropriate Uterine Fundus: firm Incision: N/A DVT Evaluation: No evidence of DVT seen on physical exam. Negative Homan's  sign. No cords or calf tenderness. No significant calf/ankle edema. Labs: Lab Results  Component Value Date   WBC 9.9 03/12/2021   HGB 13.0 03/12/2021   HCT 38.6 03/12/2021   MCV 97.0 03/12/2021   PLT 208 03/12/2021   CMP Latest Ref Rng & Units 06/25/2020  Glucose 70 - 99 mg/dL 89  BUN 6 - 23 mg/dL 10  Creatinine 0.40 - 1.20 mg/dL 0.68  Sodium 135 - 145 mEq/L 138  Potassium 3.5 - 5.1 mEq/L 4.0  Chloride 96 - 112 mEq/L 106  CO2 19 - 32 mEq/L 27  Calcium 8.4 - 10.5 mg/dL 9.4  Total Protein 6.0 - 8.3 g/dL 6.7  Total Bilirubin 0.2 - 1.2 mg/dL 0.4  Alkaline Phos 39 - 117 U/L 42  AST 0 - 37 U/L 19  ALT 0 - 35 U/L 20   Edinburgh Score: Edinburgh Postnatal Depression Scale Screening Tool 03/12/2021  I have been able to laugh and see the funny side of things. 0  I have looked forward with enjoyment to things. 0  I have blamed myself unnecessarily when things went wrong. 0  I have been anxious or worried for no good reason. 0  I have felt scared or panicky for no good reason. 0  Things have been getting on top of me. 0  I have been so unhappy that I have had difficulty sleeping.  0  I have felt sad or miserable. 0  I have been so unhappy that I have been crying. 0  The thought of harming myself has occurred to me. 0  Edinburgh Postnatal Depression Scale Total 0     After visit meds:  Allergies as of 03/13/2021   No Known Allergies      Medication List     TAKE these medications    acetaminophen 325 MG tablet Commonly known as: Tylenol Take 2 tablets (650 mg total) by mouth every 4 (four) hours as needed (for pain scale < 4).   docusate sodium 100 MG capsule Commonly known as: COLACE Take 1 capsule (100 mg total) by mouth 2 (two) times daily.   FeroSul 325 (65 FE) MG tablet Generic drug: ferrous sulfate Take 1 tablet (325 mg total) by mouth every other day. Start taking on: March 14, 2021   ibuprofen 600 MG tablet Commonly known as: ADVIL Take 1 tablet (600  mg total) by mouth every 6 (six) hours.   multivitamin-prenatal 27-0.8 MG Tabs tablet Take 1 tablet by mouth daily at 12 noon.   V-R GAS RELIEF 80 MG chewable tablet Generic drug: simethicone Chew 1 tablet (80 mg total) by mouth as needed for flatulence.         Discharge home in stable condition Infant Feeding: Breast Infant Disposition:home with mother Discharge instruction: per After Visit Summary and Postpartum booklet. Activity: Advance as tolerated. Pelvic rest for 6 weeks.  Diet: routine diet Future Appointments: Future Appointments  Date Time Provider [place]  04/09/2021  9:10 AM Leftwich-Kirby, Kathie Dike, CNM [facility]    Follow up Visit:  [place] [facility] [place] at [facility] Follow up.   Specialty: Obstetrics and Gynecology Why: 1 week for blood pressure check - virtual visit  as scheduled 12/13 for postpartum visit Contact information: [place], [place] [phone]               Message sent by Dr. Cy Blamer:  Please schedule this patient for a In person postpartum visit in 4 weeks with the following provider: Any provider. Additional Postpartum F/U:  Low risk pregnancy complicated by:  Delivery mode:  Vaginal, Spontaneous  Anticipated Birth Control:   declined  Journalist, newspaper of Student:  I confirm that I have verified the information documented in the resident's note and that I have also personally reperformed the history, physical exam and all medical decision making activities.  I have verified that all services and findings are accurately documented in this student's note; and I agree with management and plan as outlined in the documentation. I have also made any necessary editorial changes.  Patient agreeable to 1 week virtual BP check. Message sent to office to schedule.   Kerry Hough, PA-C [facility] 03/13/2021 10:54 AM 03/13/2021 Kerry Hough, PA-C

## 2021-03-13 ENCOUNTER — Other Ambulatory Visit (HOSPITAL_COMMUNITY): Payer: Self-pay

## 2021-03-13 MED ORDER — SIMETHICONE 80 MG PO CHEW
80.0000 mg | CHEWABLE_TABLET | ORAL | 0 refills | Status: DC | PRN
  Filled 2021-03-13: qty 30, 7d supply, fill #0

## 2021-03-13 MED ORDER — IBUPROFEN 600 MG PO TABS
600.0000 mg | ORAL_TABLET | Freq: Four times a day (QID) | ORAL | 0 refills | Status: DC
  Filled 2021-03-13: qty 30, 8d supply, fill #0

## 2021-03-13 MED ORDER — ACETAMINOPHEN 325 MG PO TABS
650.0000 mg | ORAL_TABLET | ORAL | 0 refills | Status: DC | PRN
  Filled 2021-03-13: qty 30, 3d supply, fill #0

## 2021-03-13 MED ORDER — FERROUS SULFATE 325 (65 FE) MG PO TABS
325.0000 mg | ORAL_TABLET | ORAL | 0 refills | Status: DC
Start: 2021-03-14 — End: 2022-01-10
  Filled 2021-03-13: qty 45, 90d supply, fill #0

## 2021-03-13 MED ORDER — DOCUSATE SODIUM 100 MG PO CAPS
100.0000 mg | ORAL_CAPSULE | Freq: Two times a day (BID) | ORAL | 0 refills | Status: DC
  Filled 2021-03-13: qty 10, 5d supply, fill #0

## 2021-03-13 NOTE — Progress Notes (Addendum)
Post Partum Day 1 Subjective: no complaints, up ad lib, voiding, tolerating PO, and + flatus  Objective: Blood pressure 116/79, pulse 84, temperature 98.4 F (36.9 C), temperature source Oral, resp. rate 16, height 5\' 6"  (1.676 m), weight 80.7 kg, last menstrual period 06/15/2020, SpO2 98 %, unknown if currently breastfeeding.  Physical Exam:  General: alert and no distress Lochia: appropriate Uterine Fundus: firm Incision: n/a DVT Evaluation: No evidence of DVT seen on physical exam. Negative Homan's sign. No cords or calf tenderness. No significant calf/ankle edema.  Recent Labs    03/12/21 0344  HGB 13.0  HCT 38.6    Assessment/Plan: Plan for discharge tomorrow or today per patient preference. Meeting all milestones #MOF: breast #MOC: declined   LOS: 1 day   03/14/21 03/13/2021, 7:03 AM

## 2021-03-13 NOTE — Progress Notes (Signed)
Patient ID: Brandi Murray, female   DOB: 07/04/1985, 35 y.o.   MRN: [id number] RN called at 1156 stating that baby will not be discharged today and patient would like to stay. Since PPD#1 this is reasonable. Discharge order cancelled.   Vonzella Nipple, PA-C 03/13/2021 11:57 AM

## 2021-03-13 NOTE — Lactation Note (Signed)
This note was copied from a baby's chart. Lactation Consultation Note  Patient Name: Girl Lilygrace Rodick HUDJS'H Date: 03/13/2021 Reason for consult: Follow-up assessment;Primapara;1st time breastfeeding;Early term 37-38.6wks;Infant weight loss;Other (Comment) (4 % weight loss) Age:34 hours As LC entered the room/ baby in the crib/ awake and rooting/ LC checked the diaper ( dry ) and offered to assist to latch.  LC assisted with positioning and depth / baby still feeding. ( Increased swallows with breast compressions )  LC reviewed breast feeding basics. Mom and dad receptive to teaching.  MBURN aware to document total time.  Latch score 9    Maternal Data Has patient been taught Hand Expression?: Yes  Feeding Mother's Current Feeding Choice: Breast Milk  LATCH Score Latch: Grasps breast easily, tongue down, lips flanged, rhythmical sucking.  Audible Swallowing: Spontaneous and intermittent  Type of Nipple: Everted at rest and after stimulation  Comfort (Breast/Nipple): Soft / non-tender  Hold (Positioning): Assistance needed to correctly position infant at breast and maintain latch.  LATCH Score: 9   Lactation Tools Discussed/Used    Interventions    Discharge Pump: Personal;DEBP WIC Program: No  Consult Status Consult Status: Follow-up Date: 03/14/21 Follow-up type: In-patient    Matilde Sprang Getsemani Lindon 03/13/2021, 2:44 PM

## 2021-03-13 NOTE — Social Work (Signed)
MOB was referred for history of anxiety.  * Referral screened out by Clinical Social Worker because none of the following criteria appear to apply: ~ History of anxiety/depression during this pregnancy, or of post-partum depression following prior delivery. ~ Diagnosis of anxiety and/or depression within last 3 years OR * MOB's symptoms currently being treated with medication and/or therapy. Per chart review, MOB sees a counselor/psychologist.   Please contact the Clinical Social Worker if needs arise, by [facility] request, or if MOB scores greater than 9/yes to question 10 on Edinburgh Postpartum Depression Screen.   Vivi Barrack, MSW, LCSW [facility]  Clinical Social Worker  [phone] 07/13/2020  9:05 AM

## 2021-03-14 ENCOUNTER — Encounter (INDEPENDENT_AMBULATORY_CARE_PROVIDER_SITE_OTHER): Payer: Self-pay

## 2021-03-14 ENCOUNTER — Other Ambulatory Visit (HOSPITAL_COMMUNITY): Payer: Self-pay

## 2021-03-14 DIAGNOSIS — Z0289 Encounter for other administrative examinations: Secondary | ICD-10-CM

## 2021-03-14 LAB — SURGICAL PATHOLOGY

## 2021-03-14 MED ORDER — WITCH HAZEL-GLYCERIN EX PADS
1.0000 "application " | MEDICATED_PAD | CUTANEOUS | 12 refills | Status: DC | PRN
  Filled 2021-03-14: qty 40, 10d supply, fill #0

## 2021-03-14 NOTE — Discharge Summary (Addendum)
Postpartum Discharge Summary  Date of Service updated 03/13/21     Patient Name: Brandi Murray DOB: 1985-09-16 MRN: [id number]  Date of admission: 03/12/2021 Delivery date:03/12/2021  Delivering provider: Christin Fudge  Date of discharge: 03/14/2021  Admitting diagnosis: Indication for care in labor or delivery [O75.9] Intrauterine pregnancy: [redacted]w[redacted]d     Secondary diagnosis:  Active Problems:   Indication for care in labor or delivery  Additional problems: n/a    Discharge diagnosis: Term Pregnancy Delivered                                              Post partum procedures: none Augmentation: AROM Complications: None  Hospital course: Onset of Labor With Vaginal Delivery      35 y.o. yo G3P0020 at [redacted]w[redacted]d was admitted in Active Labor on 03/12/2021. Patient had an uncomplicated labor course as follows:  Membrane Rupture Time/Date: 4:18 AM ,03/12/2021   Delivery Method:Vaginal, Spontaneous  Episiotomy: None  Lacerations:  2nd degree  Patient had an uncomplicated postpartum course. She had a couple of BPs ~135/85 once on the Mother Baby Unit so she didn't meet the protocol to have meds started. She is ambulating, tolerating a regular diet, passing flatus, and urinating well. Patient is discharged home in stable condition on 03/14/21 with a BP check in 1 week.  Newborn Data: Birth date:03/12/2021  Birth time:8:15 AM  Gender:Female  Living status:Living  Apgars:8 ,9  Weight:3300 g   Magnesium Sulfate received: No BMZ received: No Rhophylac:N/A MMR:N/A T-DaP: declined Flu: Yes Transfusion:No  Physical exam  Vitals:   03/13/21 0521 03/13/21 1430 03/13/21 2139 03/14/21 0545  BP: 116/79 131/85 122/83 120/82  Pulse: 84 79 69 71  Resp: $Remo'16 17 18 17  'yHjKj$ Temp: 98.4 F (36.9 C) 98.1 F (36.7 C) 98.5 F (36.9 C) 98.3 F (36.8 C)  TempSrc: Oral Oral Oral Oral  SpO2: 98% 97% 100% 98%  Weight:      Height:       General: alert, cooperative, and no  distress Lochia: appropriate Uterine Fundus: firm Incision: N/A DVT Evaluation: No evidence of DVT seen on physical exam. Negative Homan's sign. No cords or calf tenderness. No significant calf/ankle edema. Labs: Lab Results  Component Value Date   WBC 9.9 03/12/2021   HGB 13.0 03/12/2021   HCT 38.6 03/12/2021   MCV 97.0 03/12/2021   PLT 208 03/12/2021   CMP Latest Ref Rng & Units 06/25/2020  Glucose 70 - 99 mg/dL 89  BUN 6 - 23 mg/dL 10  Creatinine 0.40 - 1.20 mg/dL 0.68  Sodium 135 - 145 mEq/L 138  Potassium 3.5 - 5.1 mEq/L 4.0  Chloride 96 - 112 mEq/L 106  CO2 19 - 32 mEq/L 27  Calcium 8.4 - 10.5 mg/dL 9.4  Total Protein 6.0 - 8.3 g/dL 6.7  Total Bilirubin 0.2 - 1.2 mg/dL 0.4  Alkaline Phos 39 - 117 U/L 42  AST 0 - 37 U/L 19  ALT 0 - 35 U/L 20   Edinburgh Score: Edinburgh Postnatal Depression Scale Screening Tool 03/12/2021  I have been able to laugh and see the funny side of things. 0  I have looked forward with enjoyment to things. 0  I have blamed myself unnecessarily when things went wrong. 0  I have been anxious or worried for no good reason. 0  I have felt scared  or panicky for no good reason. 0  Things have been getting on top of me. 0  I have been so unhappy that I have had difficulty sleeping. 0  I have felt sad or miserable. 0  I have been so unhappy that I have been crying. 0  The thought of harming myself has occurred to me. 0  Edinburgh Postnatal Depression Scale Total 0     After visit meds:  Allergies as of 03/14/2021   No Known Allergies      Medication List     TAKE these medications    acetaminophen 325 MG tablet Commonly known as: Tylenol Take 2 tablets (650 mg total) by mouth every 4 (four) hours as needed (for pain scale < 4).   docusate sodium 100 MG capsule Commonly known as: COLACE Take 1 capsule (100 mg total) by mouth 2 (two) times daily.   FeroSul 325 (65 FE) MG tablet Generic drug: ferrous sulfate Take 1 tablet (325 mg  total) by mouth every other day.   ibuprofen 600 MG tablet Commonly known as: ADVIL Take 1 tablet (600 mg total) by mouth every 6 (six) hours.   multivitamin-prenatal 27-0.8 MG Tabs tablet Take 1 tablet by mouth daily at 12 noon.   V-R GAS RELIEF 80 MG chewable tablet Generic drug: simethicone Chew 1 tablet (80 mg total) by mouth as needed for flatulence.   witch hazel-glycerin pad Commonly known as: TUCKS Apply 1 application topically as needed for hemorrhoids.         Discharge home in stable condition Infant Feeding: Breast Infant Disposition:home with mother Discharge instruction: per After Visit Summary and Postpartum booklet. Activity: Advance as tolerated. Pelvic rest for 6 weeks.  Diet: routine diet Future Appointments: Future Appointments  Date Time Provider [place]  03/19/2021 11:10 AM Rasch, Artist Pais, NP [facility]  04/09/2021  9:10 AM Leftwich-Kirby, Kathie Dike, CNM [facility]    Follow up Visit:  [place] [facility] Follow up.   Specialty: Obstetrics and Gynecology Why: 1 week for blood pressure check - virtual visit  as scheduled 12/13 for postpartum visit Contact information: [place], [place] [phone]               Message sent by Dr. Cy Blamer:  Please schedule this patient for a In person postpartum visit in 4 weeks with the following provider: Any provider. Additional Postpartum F/U:  Low risk pregnancy complicated by:  Delivery mode:  Vaginal, Spontaneous  Anticipated Birth Control:   declined   Patient agreeable to 1 week virtual BP check. Message sent to office to schedule.    Myrtis Ser 03/14/2021 9:19 AM

## 2021-03-19 ENCOUNTER — Encounter: Payer: BC Managed Care – PPO | Admitting: Obstetrics and Gynecology

## 2021-03-19 ENCOUNTER — Ambulatory Visit (INDEPENDENT_AMBULATORY_CARE_PROVIDER_SITE_OTHER): Payer: BC Managed Care – PPO

## 2021-03-19 ENCOUNTER — Other Ambulatory Visit: Payer: Self-pay

## 2021-03-19 ENCOUNTER — Telehealth (INDEPENDENT_AMBULATORY_CARE_PROVIDER_SITE_OTHER): Payer: BC Managed Care – PPO | Admitting: Obstetrics and Gynecology

## 2021-03-19 VITALS — BP 132/77 | HR 77

## 2021-03-19 DIAGNOSIS — Z0130 Encounter for examination of blood pressure without abnormal findings: Secondary | ICD-10-CM

## 2021-03-19 NOTE — Progress Notes (Signed)
  [name] is a 35 y.o.Female Status post vaginal delivery on 03/12/2021 Two Bp's throughout stay of 134/80 & 134/83. DC vitals were 116/79 She was scheduled for a 1 week BP check virtual.  No headaches or changes in vision. Did have headache yesterday and that went away on its own.  Feeling good, no complaints.  Patient is at home, provider is in the office at [facility]   121/90- first check. 123/97 2nd check.   Patient to come to the office today for BP check. If elevated, will order [facility] labs.    Duane Lope, NP 03/21/2021 9:47 AM

## 2021-03-19 NOTE — Progress Notes (Addendum)
Pt was seen virtually this morning and is now in office for nurse visit BP check per Venia Carbon, NP. BP:132/77 P:77. Pt denies headache or visual changes. No complaints today. Per Venia Carbon, NP pt does not need labs today and needs to return on 11/28 for BP check. Pt scheduled.    I agree with the nurses note and plan of care.  Duane Lope, NP 03/21/2021 8:26 PM

## 2021-03-21 DIAGNOSIS — Z0130 Encounter for examination of blood pressure without abnormal findings: Secondary | ICD-10-CM | POA: Insufficient documentation

## 2021-03-22 ENCOUNTER — Inpatient Hospital Stay (HOSPITAL_COMMUNITY): Admit: 2021-03-22 | Payer: Self-pay

## 2021-03-25 ENCOUNTER — Telehealth (HOSPITAL_COMMUNITY): Payer: Self-pay | Admitting: *Deleted

## 2021-03-25 ENCOUNTER — Other Ambulatory Visit: Payer: Self-pay

## 2021-03-25 ENCOUNTER — Ambulatory Visit (INDEPENDENT_AMBULATORY_CARE_PROVIDER_SITE_OTHER): Payer: BC Managed Care – PPO

## 2021-03-25 VITALS — BP 119/70 | HR 69

## 2021-03-25 DIAGNOSIS — Z0130 Encounter for examination of blood pressure without abnormal findings: Secondary | ICD-10-CM

## 2021-03-25 NOTE — Telephone Encounter (Signed)
Mom reports feeling good. No concerns about herself at this time. Declined EPDS, but reports she's feeling fine emotionally. [facility] score= 0) Mom reports baby is doing well. Feeding, peeing, and pooping without difficulty. Safe sleep reviewed. Mom reports no concerns about baby at present.  Duffy Rhody, RN 03-25-2021 at 3:30pm

## 2021-03-25 NOTE — Progress Notes (Signed)
Pt in office today for BP check per Venia Carbon, NP. Pt has no issues or concerns today. BP 119/70. P 69. Pt was told to call office if anything changes. Pt will return for postpartum appt on 04/09/21.

## 2021-04-09 ENCOUNTER — Ambulatory Visit (INDEPENDENT_AMBULATORY_CARE_PROVIDER_SITE_OTHER): Payer: BC Managed Care – PPO | Admitting: Advanced Practice Midwife

## 2021-04-09 ENCOUNTER — Other Ambulatory Visit: Payer: Self-pay

## 2021-04-09 NOTE — Progress Notes (Signed)
Post Partum Visit Note  Brandi Murray is a 35 y.o. G56P1021 female who presents for a postpartum visit. She is 4 weeks postpartum following a normal spontaneous vaginal delivery.  I have fully reviewed the prenatal and intrapartum course. The delivery was at 38.4 gestational weeks.  Anesthesia: none. Postpartum course has been unremarkable. Baby is doing well. Baby is feeding by breast milk. Bleeding staining only. Bowel function is normal. Bladder function is normal. Patient is not sexually active. Contraception method is none. Postpartum depression screening: negative.   The pregnancy intention screening data noted above was reviewed. Potential methods of contraception were discussed. The patient elected to proceed with No data recorded.   Edinburgh Postnatal Depression Scale - 04/09/21 0929       Edinburgh Postnatal Depression Scale:  In the Past 7 Days   I have been able to laugh and see the funny side of things. 0    I have looked forward with enjoyment to things. 0    I have blamed myself unnecessarily when things went wrong. 0    I have been anxious or worried for no good reason. 0    I have felt scared or panicky for no good reason. 0    Things have been getting on top of me. 0    I have been so unhappy that I have had difficulty sleeping. 0    I have felt sad or miserable. 0    I have been so unhappy that I have been crying. 0    The thought of harming myself has occurred to me. 0    Edinburgh Postnatal Depression Scale Total 0             Health Maintenance Due  Topic Date Due   COVID-19 Vaccine (2 - Booster for Janssen series) 09/29/2019    The following portions of the patient's history were reviewed and updated as appropriate: allergies, current medications, past family history, past medical history, past social history, past surgical history, and problem list.  Review of Systems Pertinent items noted in HPI and remainder of comprehensive ROS otherwise  negative.  Objective:  BP 96/61   Pulse 72   Ht 5\' 6"  (1.676 m)   Wt 147 lb (66.7 kg)   LMP 06/15/2020 (Exact Date)   Breastfeeding Yes   BMI 23.73 kg/m   VS reviewed, nursing note reviewed,  Constitutional: well developed, well nourished, no distress HEENT: normocephalic CV: normal rate Pulm/chest wall: normal effort Abdomen: soft Neuro: alert and oriented x 3 Skin: warm, dry Psych: affect normal  Assessment:   1. Postpartum care following vaginal delivery --Doing well, bonding well with baby, good support at home --Does not desire contraception, had fertility problems so would welcome another pregnancy.    Plan:   Essential components of care per ACOG recommendations:  1.  Mood and well being: Patient with negative depression screening today. Reviewed local resources for support.  - Patient tobacco use? No.   - hx of drug use? No.    2. Infant care and feeding:  -Patient currently breastmilk feeding? Yes. Discussed returning to work and pumping. Reviewed importance of draining breast regularly to support lactation.  -Social determinants of health (SDOH) reviewed in EPIC. No concerns  3. Sexuality, contraception and birth spacing - Patient does not want a pregnancy in the next year but would welcome if it occurred. - Reviewed contraceptive choices. Patient desired no method today.   - Discussed birth spacing of 20  months  4. Sleep and fatigue -Encouraged family/partner/community support of 4 hrs of uninterrupted sleep to help with mood and fatigue  5. Physical Recovery  - Discussed patients delivery and complications. She describes her labor as good. - Patient had a Vaginal, no problems at delivery. Patient had a 2nd degree laceration. Perineal healing reviewed. Patient expressed understanding - Patient has urinary incontinence? No. - Patient is safe to resume physical and sexual activity  6.  Health Maintenance - HM due items addressed Yes - Last pap smear   Diagnosis  Date Value Ref Range Status  04/26/2020   Final   - Negative for Intraepithelial Lesions or Malignancy (NILM)  04/26/2020 - Benign reactive/reparative changes  Final   Pap smear not done at today's visit.  -Breast Cancer screening indicated? No.   7. Chronic Disease/Pregnancy Condition follow up:  No HTN but borderline BPs at hospital discharge followed in office and continued to be normal.  - PCP follow up  Sharen Counter, CNM [facility]

## 2021-07-02 ENCOUNTER — Ambulatory Visit (INDEPENDENT_AMBULATORY_CARE_PROVIDER_SITE_OTHER): Payer: BC Managed Care – PPO | Admitting: Family Medicine

## 2021-07-02 ENCOUNTER — Encounter: Payer: Self-pay | Admitting: Family Medicine

## 2021-07-02 VITALS — BP 110/64 | HR 66 | Temp 98.0°F | Ht 66.0 in | Wt 138.4 lb

## 2021-07-02 DIAGNOSIS — Z0000 Encounter for general adult medical examination without abnormal findings: Secondary | ICD-10-CM

## 2021-07-02 LAB — COMPREHENSIVE METABOLIC PANEL
ALT: 15 U/L (ref 0–35)
AST: 19 U/L (ref 0–37)
Albumin: 4.3 g/dL (ref 3.5–5.2)
Alkaline Phosphatase: 74 U/L (ref 39–117)
BUN: 10 mg/dL (ref 6–23)
CO2: 27 mEq/L (ref 19–32)
Calcium: 9.7 mg/dL (ref 8.4–10.5)
Chloride: 106 mEq/L (ref 96–112)
Creatinine, Ser: 0.66 mg/dL (ref 0.40–1.20)
GFR: 114.2 mL/min (ref >60.00)
Glucose, Bld: 80 mg/dL (ref 70–99)
Potassium: 4 mEq/L (ref 3.5–5.1)
Sodium: 139 mEq/L (ref 135–145)
Total Bilirubin: 0.6 mg/dL (ref 0.2–1.2)
Total Protein: 7 g/dL (ref 6.0–8.3)

## 2021-07-02 LAB — CBC
HCT: 40 % (ref 36.0–46.0)
Hemoglobin: 13.4 g/dL (ref 12.0–15.0)
MCHC: 33.5 g/dL (ref 30.0–36.0)
MCV: 95.2 fl (ref 78.0–100.0)
Platelets: 253 10*3/uL (ref 150.0–400.0)
RBC: 4.21 Mil/uL (ref 3.87–5.11)
RDW: 13.2 % (ref 11.5–15.5)
WBC: 5.1 10*3/uL (ref 4.0–10.5)

## 2021-07-02 LAB — LIPID PANEL
Cholesterol: 151 mg/dL (ref 0–200)
HDL: 68.3 mg/dL (ref >39.00)
LDL Cholesterol: 74 mg/dL (ref 0–99)
NonHDL: 82.51
Total CHOL/HDL Ratio: 2
Triglycerides: 43 mg/dL (ref 0.0–149.0)
VLDL: 8.6 mg/dL (ref 0.0–40.0)

## 2021-07-02 NOTE — Progress Notes (Signed)
Chief Complaint  ?Patient presents with  ? Annual Exam  ?  ? ?Well Woman ?Brandi Murray is here for a complete physical.   ?Her last physical was >1 year ago.  ?Current diet: in general, a "healthy" diet. ?Current exercise: walking ?Fatigue out of ordinary? No ?Seatbelt? Yes ?Advanced directive? No ? ?Health Maintenance ?Pap/HPV- Yes ?Tetanus- Yes ?HIV screening- Yes ?Hep C screening- Yes ? ?Past Medical History:  ?Diagnosis Date  ? Medical history non-contributory   ? No known health problems   ?  ? ?Past Surgical History:  ?Procedure Laterality Date  ? NO PAST SURGERIES    ? WISDOM TOOTH EXTRACTION    ? ? ?Medications  ?Current Outpatient Medications on File Prior to Visit  ?Medication Sig Dispense Refill  ? ferrous sulfate 325 (65 FE) MG tablet Take 1 tablet (325 mg total) by mouth every other day. 45 tablet 0  ? Prenatal Vit-Fe Fumarate-FA (MULTIVITAMIN-PRENATAL) 27-0.8 MG TABS tablet Take 1 tablet by mouth daily at 12 noon.    ? ?Allergies ?No Known Allergies ? ?Review of Systems: ?Constitutional:  no unexpected weight changes ?Eye:  no recent significant change in vision ?Ear/Nose/Mouth/Throat:  Ears:  no tinnitus or vertigo and no recent change in hearing ?Nose/Mouth/Throat:  no complaints of nasal congestion, no sore throat ?Cardiovascular: no chest pain ?Respiratory:  no cough and no shortness of breath ?Gastrointestinal:  no abdominal pain, no change in bowel habits ?GU:  Female: negative for dysuria or pelvic pain ?Musculoskeletal/Extremities:  no pain of the joints ?Integumentary (Skin/Breast):  no abnormal skin lesions reported ?Neurologic:  no headaches ?Endocrine:  denies fatigue ?Hematologic/Lymphatic:  No areas of easy bleeding ? ?Exam ?BP 110/64   Pulse 66   Temp 98 ?F (36.7 ?C) (Oral)   Ht 5\' 6"  (1.676 m)   Wt 138 lb 6 oz (62.8 kg)   SpO2 99%   BMI 22.33 kg/m?  ?General:  well developed, well nourished, in no apparent distress ?Skin:  no significant moles, warts, or growths ?Head:  no  masses, lesions, or tenderness ?Eyes:  pupils equal and round, sclera anicteric without injection ?Ears:  canals without lesions, TMs shiny without retraction, no obvious effusion, no erythema ?Nose:  nares patent, septum midline, mucosa normal, and no drainage or sinus tenderness ?Throat/Pharynx:  lips and gingiva without lesion; tongue and uvula midline; non-inflamed pharynx; no exudates or postnasal drainage ?Neck: neck supple without adenopathy, thyromegaly, or masses ?Lungs:  clear to auscultation, breath sounds equal bilaterally, no respiratory distress ?Cardio:  regular rate and rhythm, no bruits, no LE edema ?Abdomen:  abdomen soft, nontender; bowel sounds normal; no masses or organomegaly ?Genital: Defer to GYN ?Musculoskeletal:  symmetrical muscle groups noted without atrophy or deformity ?Extremities:  no clubbing, cyanosis, or edema, no deformities, no skin discoloration ?Neuro:  gait normal; deep tendon reflexes normal and symmetric ?Psych: well oriented with normal range of affect and appropriate judgment/insight ? ?Assessment and Plan ? ?Well adult exam - Plan: CBC, Comprehensive metabolic panel, Lipid panel  ? ?Well 36 y.o. female. ?Counseled on diet and exercise. ?Advanced directive form provided today. ?Other orders as above. ?Follow up in 1 yr or prn. ?The patient voiced understanding and agreement to the plan. ? ?20, DO ?07/02/21 ?9:03 AM ? ?

## 2021-07-02 NOTE — Patient Instructions (Signed)
Give us 2-3 business days to get the results of your labs back.   Keep the diet clean and stay active.  Please get me a copy of your advanced directive form at your convenience.   Let us know if you need anything.  

## 2021-07-10 ENCOUNTER — Other Ambulatory Visit (HOSPITAL_COMMUNITY): Payer: Self-pay

## 2021-07-22 ENCOUNTER — Telehealth: Payer: Self-pay | Admitting: *Deleted

## 2021-07-22 NOTE — Telephone Encounter (Signed)
Returned call. Left patient a message to call the office for information. ?

## 2021-07-22 NOTE — Telephone Encounter (Signed)
-----   Message from Kathie Dike, [place] sent at 07/22/2021  2:47 PM EDT ----- ?Regarding: Prior Authorization ?Brandi Murray, ? ?This pt has questions about a prior authorization. I told her you will reach out to her when you have free time.  ? ?Thanks  ? ?

## 2021-12-31 ENCOUNTER — Encounter: Payer: Self-pay | Admitting: *Deleted

## 2021-12-31 DIAGNOSIS — Z3480 Encounter for supervision of other normal pregnancy, unspecified trimester: Secondary | ICD-10-CM | POA: Insufficient documentation

## 2022-01-10 ENCOUNTER — Encounter: Payer: Self-pay | Admitting: Advanced Practice Midwife

## 2022-01-10 ENCOUNTER — Ambulatory Visit (INDEPENDENT_AMBULATORY_CARE_PROVIDER_SITE_OTHER): Payer: BC Managed Care – PPO

## 2022-01-10 ENCOUNTER — Other Ambulatory Visit (HOSPITAL_COMMUNITY)
Admission: RE | Admit: 2022-01-10 | Discharge: 2022-01-10 | Disposition: A | Discharge status: Home / Self Care | Payer: BC Managed Care – PPO | Source: Ambulatory Visit | Attending: Advanced Practice Midwife | Admitting: Advanced Practice Midwife

## 2022-01-10 ENCOUNTER — Ambulatory Visit (INDEPENDENT_AMBULATORY_CARE_PROVIDER_SITE_OTHER): Payer: BC Managed Care – PPO | Admitting: Advanced Practice Midwife

## 2022-01-10 VITALS — BP 112/74 | HR 76 | Wt 136.0 lb

## 2022-01-10 DIAGNOSIS — Z3480 Encounter for supervision of other normal pregnancy, unspecified trimester: Secondary | ICD-10-CM | POA: Insufficient documentation

## 2022-01-10 DIAGNOSIS — Z3A10 10 weeks gestation of pregnancy: Secondary | ICD-10-CM

## 2022-01-10 DIAGNOSIS — Z3481 Encounter for supervision of other normal pregnancy, first trimester: Secondary | ICD-10-CM

## 2022-01-10 NOTE — Progress Notes (Signed)
Bedside U/S shows single IUP CRL is consistent with LMP

## 2022-01-10 NOTE — Progress Notes (Signed)
Subjective:   Brandi Murray is a 36 y.o. [phone] at [redacted]w[redacted]d by LMP being seen today for her first obstetrical visit.  Her obstetrical history is significant for  previous term vaginal delivery  and has Postpartum care following vaginal delivery and Supervision of other normal pregnancy, antepartum on their problem list.. Patient does intend to breast feed. Pregnancy history fully reviewed.  Patient reports nausea.  HISTORY: OB History  Gravida Para Term Preterm AB Living  4 1 1  0 2 1  SAB IAB Ectopic Multiple Live Births  2 0 0 0 1    # Outcome Date GA Lbr Len/2nd Weight Sex Delivery Anes PTL Lv  4 Current           3 Term 03/12/21 [redacted]w[redacted]d 06:57 / 00:48 7 lb 4.4 oz (3.3 kg) F Vag-Spont None  LIV     Name: Brandi Murray     Apgar1: 8  Apgar5: 9  2 SAB 04/28/19          1 SAB 09/09/18 [redacted]w[redacted]d          Past Medical History:  Diagnosis Date   No known health problems    Past Surgical History:  Procedure Laterality Date   NO PAST SURGERIES     WISDOM TOOTH EXTRACTION     Family History  Problem Relation Age of Onset   Stroke Father    Epilepsy Father    Cancer Neg Hx    Heart disease Neg Hx    Social History   Tobacco Use   Smoking status: Never    Passive exposure: Never   Smokeless tobacco: Never  Vaping Use   Vaping Use: Never used  Substance Use Topics   Alcohol use: Not Currently    Comment: social   Drug use: No   No Known Allergies Current Outpatient Medications on File Prior to Visit  Medication Sig Dispense Refill   Prenatal Vit-Fe Fumarate-FA (MULTIVITAMIN-PRENATAL) 27-0.8 MG TABS tablet Take 1 tablet by mouth daily at 12 noon.     No current facility-administered medications on file prior to visit.     Indications for ASA therapy (per uptodate) One of the following: Previous pregnancy with preeclampsia, especially early onset and with an adverse outcome No Multifetal gestation No Chronic hypertension No Type 1 or 2 diabetes mellitus  No Chronic kidney disease No Autoimmune disease (antiphospholipid syndrome, systemic lupus erythematosus) No   Two or more of the following: Nulliparity No Obesity (body mass index >30 kg/m2) No Family history of preeclampsia in mother or sister No Age ?35 years No Sociodemographic characteristics (African American race, low socioeconomic level) No Personal risk factors (eg, previous pregnancy with low birth weight or small for gestational age infant, previous adverse pregnancy outcome [eg, stillbirth], interval >10 years between pregnancies) No   Indications for early 1 hour GTT (per uptodate)  BMI >25 (>23 in Asian women) AND one of the following No, BMI 21  Exam   Vitals:   01/10/22 0851  BP: 112/74  Pulse: 76  Weight: 136 lb (61.7 kg)   Fetal Heart Rate (bpm): 167  Uterus:     Pelvic Exam: Perineum: no hemorrhoids, normal perineum   Vulva: normal external genitalia, no lesions   Vagina:  normal mucosa, normal discharge   Cervix: no lesions and normal, pap smear done.    Adnexa: normal adnexa and no mass, fullness, tenderness   Bony Pelvis: average  System: General: well-developed, well-nourished female in no acute distress  Breast:  normal appearance, no masses or tenderness   Skin: normal coloration and turgor, no rashes   Neurologic: oriented, normal, negative, normal mood   Extremities: normal strength, tone, and muscle mass, ROM of all joints is normal   HEENT PERRLA, extraocular movement intact and sclera clear, anicteric   Mouth/Teeth mucous membranes moist, pharynx normal without lesions and dental hygiene good   Neck supple and no masses   Cardiovascular: regular rate and rhythm   Respiratory:  no respiratory distress, normal breath sounds   Abdomen: soft, non-tender; bowel sounds normal; no masses,  no organomegaly     Assessment:   Pregnancy: [id number] Patient Active Problem List   Diagnosis Date Noted   Supervision of other normal pregnancy,  antepartum 12/31/2021   Postpartum care following vaginal delivery 04/09/2021     Plan:  1. Supervision of other normal pregnancy, antepartum --Anticipatory guidance about next visits/weeks of pregnancy given.  - US OB Limited; Future - Cervicovaginal ancillary only( [place]) - Obstetric panel - HIV antibody (with reflex) - Hepatitis C Antibody - Culture, OB Urine - Babyscripts Schedule Optimization  2. [redacted] weeks gestation of pregnancy    Initial labs drawn. Continue prenatal vitamins. Discussed and offered genetic screening options, including Quad screen/AFP, NIPS testing, and option to decline testing. Benefits/risks/alternatives reviewed. Pt aware that anatomy US is form of genetic screening with lower accuracy in detecting trisomies than blood work.  Pt chooses genetic screening today. NIPS: requested. Ultrasound discussed; fetal anatomic survey: requested. Problem list reviewed and updated. The nature of [facility] with multiple MDs and other Advanced Practice Providers was explained to patient; also emphasized that residents, students are part of our team. Routine obstetric precautions reviewed. Return in about 4 weeks (around 02/07/2022) for LOB, Any provider.   Sharen Counter, CNM 01/10/22 12:35 PM

## 2022-01-13 LAB — CERVICOVAGINAL ANCILLARY ONLY
Chlamydia: NEGATIVE
Comment: NEGATIVE
Comment: NORMAL
Neisseria Gonorrhea: NEGATIVE

## 2022-01-14 LAB — OBSTETRIC PANEL
Absolute Monocytes: 385 cells/uL (ref 200–950)
Antibody Screen: NOT DETECTED
Basophils Absolute: 16 cells/uL (ref 0–200)
Basophils Relative: 0.2 %
Eosinophils Absolute: 8 cells/uL — ABNORMAL LOW (ref 15–500)
Eosinophils Relative: 0.1 %
HCT: 41.9 % (ref 35.0–45.0)
Hemoglobin: 14 g/dL (ref 11.7–15.5)
Hepatitis B Surface Ag: NONREACTIVE
Lymphs Abs: 2017 cells/uL (ref 850–3900)
MCH: 32.4 pg (ref 27.0–33.0)
MCHC: 33.4 g/dL (ref 32.0–36.0)
MCV: 97 fL (ref 80.0–100.0)
MPV: 12.6 fL — ABNORMAL HIGH (ref 7.5–12.5)
Monocytes Relative: 4.7 %
Neutro Abs: 5773 cells/uL (ref 1500–7800)
Neutrophils Relative %: 70.4 %
Platelets: 259 10*3/uL (ref 140–400)
RBC: 4.32 10*6/uL (ref 3.80–5.10)
RDW: 11.9 % (ref 11.0–15.0)
RPR Ser Ql: NONREACTIVE
Rubella: 6.67 Index
Total Lymphocyte: 24.6 %
WBC: 8.2 10*3/uL (ref 3.8–10.8)

## 2022-01-14 LAB — HEPATITIS C ANTIBODY: Hepatitis C Ab: NONREACTIVE

## 2022-01-14 LAB — HIV ANTIBODY (ROUTINE TESTING W REFLEX): HIV 1&2 Ab, 4th Generation: NONREACTIVE

## 2022-01-15 LAB — URINE CULTURE, OB REFLEX

## 2022-01-15 LAB — CULTURE, OB URINE

## 2022-01-24 DIAGNOSIS — Z3481 Encounter for supervision of other normal pregnancy, first trimester: Secondary | ICD-10-CM | POA: Diagnosis not present

## 2022-01-30 LAB — PANORAMA PRENATAL TEST FULL PANEL:PANORAMA TEST PLUS 5 ADDITIONAL MICRODELETIONS: FETAL FRACTION: 10.8

## 2022-02-06 ENCOUNTER — Ambulatory Visit (INDEPENDENT_AMBULATORY_CARE_PROVIDER_SITE_OTHER): Payer: BC Managed Care – PPO | Admitting: Obstetrics and Gynecology

## 2022-02-06 VITALS — BP 112/70 | HR 69 | Wt 140.0 lb

## 2022-02-06 DIAGNOSIS — Z3A14 14 weeks gestation of pregnancy: Secondary | ICD-10-CM

## 2022-02-06 DIAGNOSIS — Z23 Encounter for immunization: Secondary | ICD-10-CM | POA: Diagnosis not present

## 2022-02-06 DIAGNOSIS — Z3480 Encounter for supervision of other normal pregnancy, unspecified trimester: Secondary | ICD-10-CM

## 2022-02-06 DIAGNOSIS — R000 Tachycardia, unspecified: Secondary | ICD-10-CM

## 2022-02-06 DIAGNOSIS — O09529 Supervision of elderly multigravida, unspecified trimester: Secondary | ICD-10-CM

## 2022-02-06 NOTE — Addendum Note (Signed)
Addended by: Lyndal Rainbow on: 02/06/2022 02:26 PM   Modules accepted: Orders

## 2022-02-06 NOTE — Progress Notes (Signed)
   PRENATAL VISIT NOTE  Subjective:  Brandi Murray is a 36 y.o. [phone] at [redacted]w[redacted]d being seen today for ongoing prenatal care.  She is currently monitored for the following issues for this low-risk pregnancy and has Supervision of other normal pregnancy, antepartum and AMA (advanced maternal age) multigravida 35+, unspecified trimester on their problem list.  Patient reports no complaints except occasional heart racing and chest pressure. She states it lasts for a few seconds and less than one minute.  Contractions: Not present. Vag. Bleeding: None.  Movement: Absent. Denies leaking of fluid.   The following portions of the patient's history were reviewed and updated as appropriate: allergies, current medications, past family history, past medical history, past social history, past surgical history and problem list.   Objective:   Vitals:   02/06/22 1351  BP: 112/70  Pulse: 69  Weight: 140 lb (63.5 kg)    Fetal Status: Fetal Heart Rate (bpm): 153   Movement: Absent     General:  Alert, oriented and cooperative. Patient is in no acute distress.  Skin: Skin is warm and dry. No rash noted.   Cardiovascular: Normal heart rate noted  Respiratory: Normal respiratory effort, no problems with respiration noted  Abdomen: Soft, gravid, appropriate for gestational age.  Pain/Pressure: Absent     Pelvic: Cervical exam deferred        Extremities: Normal range of motion.  Edema: None  Mental Status: Normal mood and affect. Normal behavior. Normal judgment and thought content.   Assessment and Plan:  Pregnancy: G4P1021 at [redacted]w[redacted]d 1. Supervision of other normal pregnancy, antepartum NIPS wnl. Normal labs.  MSAFP next time OB US slip given for anatomy Offered and recommend flu shot - she accepts.   Preterm labor symptoms and general obstetric precautions including but not limited to vaginal bleeding, contractions, leaking of fluid and fetal movement were reviewed in detail with the  patient. Please refer to After Visit Summary for other counseling recommendations.   Return in about 6 weeks (around 03/20/2022) for OB VISIT, MD or APP.  No future appointments.   Radene Gunning, MD

## 2022-02-06 NOTE — Progress Notes (Signed)
Pt c/o "pressure on her chest that comes and goes"

## 2022-02-14 ENCOUNTER — Ambulatory Visit (INDEPENDENT_AMBULATORY_CARE_PROVIDER_SITE_OTHER): Payer: BC Managed Care – PPO | Admitting: Cardiology

## 2022-02-14 ENCOUNTER — Other Ambulatory Visit (INDEPENDENT_AMBULATORY_CARE_PROVIDER_SITE_OTHER): Payer: BC Managed Care – PPO

## 2022-02-14 VITALS — BP 106/76 | HR 62 | Ht 66.0 in | Wt 142.4 lb

## 2022-02-14 DIAGNOSIS — R0602 Shortness of breath: Secondary | ICD-10-CM

## 2022-02-14 DIAGNOSIS — R002 Palpitations: Secondary | ICD-10-CM

## 2022-02-14 NOTE — Progress Notes (Unsigned)
Cardio-Obstetrics Clinic  New Evaluation  Date:  02/15/2022   ID:  Brandi Murray, DOB December 23, 1985, MRN [id number]  PCP:  Sharlene Dory, DO   [facility] Providers Cardiologist:  None  Electrophysiologist:  None       Referring MD: Sharlene Dory*   Chief Complaint: " I   History of Present Illness:    Brandi Murray is a 36 y.o. female [G4P1021] who is being seen today for the evaluation of palpations and shortness of breath at the request of Sharlene Dory*.   She denies any medical history - tells me that recently she has been experiencing worsening shortness of breath on exertion just with limited activity. She notes that se is also experiencing intermittent palpitations. She notes that with the palpitations are getting worse She tells me that she feels significantly lightheaded with these episode. No syncope.   Prior CV Studies Reviewed: The following studies were reviewed today:   Past Medical History:  Diagnosis Date   No known health problems     Past Surgical History:  Procedure Laterality Date   NO PAST SURGERIES     WISDOM TOOTH EXTRACTION        OB History     Gravida  4   Para  1   Term  1   Preterm  0   AB  2   Living  1      SAB  2   IAB  0   Ectopic  0   Multiple  0   Live Births  1               Current Medications: No outpatient medications have been marked as taking for the 02/14/22 encounter (Office Visit) with Thomasene Ripple, DO.     Allergies:   Patient has no known allergies.   Social History   Socioeconomic History   Marital status: Married    Spouse name: Not on file   Number of children: 0   Years of education: Not on file   Highest education level: Not on file  Occupational History   Not on file  Tobacco Use   Smoking status: Never    Passive exposure: Never   Smokeless tobacco: Never  Vaping Use   Vaping Use: Never used  Substance and Sexual Activity   Alcohol  use: Not Currently    Comment: social   Drug use: No   Sexual activity: Yes    Partners: Male    Birth control/protection: None  Other Topics Concern   Not on file  Social History Narrative   Not on file   Social Determinants of Health   Financial Resource Strain: Not on file  Food Insecurity: Not on file  Transportation Needs: Not on file  Physical Activity: Not on file  Stress: Not on file  Social Connections: Not on file      Family History  Problem Relation Age of Onset   Stroke Father    Epilepsy Father    Cancer Neg Hx    Heart disease Neg Hx       ROS:   Please see the history of present illness.    Shortness of breath and palpitations. All other systems reviewed and are negative.   Labs/EKG Reviewed:    EKG:   EKG is was ordered today.  The ekg ordered today demonstrates sinus rhyhtm Hr 62 bpm  Recent Labs: 07/02/2021: ALT 15; BUN 10; Creatinine, Ser 0.66; Potassium  4.0; Sodium 139 01/10/2022: Hemoglobin 14.0; Platelets 259   Recent Lipid Panel Lab Results  Component Value Date/Time   CHOL 151 07/02/2021 09:17 AM   TRIG 43.0 07/02/2021 09:17 AM   HDL 68.30 07/02/2021 09:17 AM   CHOLHDL 2 07/02/2021 09:17 AM   LDLCALC 74 07/02/2021 09:17 AM    Physical Exam:    VS:  BP 106/76   Pulse 62   Ht 5\' 6"  (1.676 m)   Wt 142 lb 6.4 oz (64.6 kg)   LMP 10/29/2021   SpO2 94%   BMI 22.98 kg/m     Wt Readings from Last 3 Encounters:  02/14/22 142 lb 6.4 oz (64.6 kg)  02/06/22 140 lb (63.5 kg)  01/10/22 136 lb (61.7 kg)     GEN:  Well nourished, well developed in no acute distress HEENT: Normal NECK: No JVD; No carotid bruits LYMPHATICS: No lymphadenopathy CARDIAC: RRR, no murmurs, rubs, gallops RESPIRATORY:  Clear to auscultation without rales, wheezing or rhonchi  ABDOMEN: Soft, non-tender, non-distended MUSCULOSKELETAL:  No edema; No deformity  SKIN: Warm and dry NEUROLOGIC:  Alert and oriented x 3 PSYCHIATRIC:  Normal affect    Risk  Assessment/Risk Calculators:     CARPREG II Risk Prediction Index Score:  1.  The patient's risk for a primary cardiac event is 5%.            ASSESSMENT & PLAN:    Palpitations Dyspnea on exertions  Plan for zio monitor Will get echo to assess for structural abnormalites Fu 14 weeks  Patient Instructions  Medication Instructions:  Your physician recommends that you continue on your current medications as directed. Please refer to the Current Medication list given to you today.  *If you need a refill on your cardiac medications before your next appointment, please call your pharmacy*   Lab Work: NONE If you have labs (blood work) drawn today and your tests are completely normal, you will receive your results only by: [place] (if you have MyChart) OR A paper copy in the mail If you have any lab test that is abnormal or we need to change your treatment, we will call you to review the results.   Testing/Procedures: Your physician has requested that you have an echocardiogram. Echocardiography is a painless test that uses sound waves to create images of your heart. It provides your doctor with information about the size and shape of your heart and how well your heart's chambers and valves are working. This procedure takes approximately one hour. There are no restrictions for this procedure. Please do NOT wear cologne, perfume, aftershave, or lotions (deodorant is allowed). Please arrive 15 minutes prior to your appointment time.  ZIO XT- Long Term Monitor Instructions  Your physician has requested you wear a ZIO patch monitor for 7 days.  This is a single patch monitor. [employer] supplies one patch monitor per enrollment. Additional stickers are not available. Please do not apply patch if you will be having a Nuclear Stress Test,  Echocardiogram, Cardiac CT, MRI, or Chest Xray during the period you would be wearing the  monitor. The patch cannot be worn during these  tests. You cannot remove and re-apply the  ZIO XT patch monitor.  Your ZIO patch monitor will be mailed 3 day USPS to your address on file. It may take 3-5 days  to receive your monitor after you have been enrolled.  Once you have received your monitor, please review the enclosed instructions. Your monitor  has already  been registered assigning a specific monitor serial # to you.  Billing and Patient Assistance Program Information  We have supplied [employer] with any of your insurance information on file for billing purposes. [employer] offers a sliding scale Patient Assistance Program for patients that do not have  insurance, or whose insurance does not completely cover the cost of the ZIO monitor.  You must apply for the Patient Assistance Program to qualify for this discounted rate.  To apply, please call [employer] at [phone], select option 4, select option 2, ask to apply for  Patient Assistance Program. Meredeth Ide will ask your household income, and how many people  are in your household. They will quote your out-of-pocket cost based on that information.  [employer] will also be able to set up a 67-month, interest-free payment plan if needed.  Applying the monitor   Shave hair from upper left chest.  Hold abrader disc by orange tab. Rub abrader in 40 strokes over the upper left chest as  indicated in your monitor instructions.  Clean area with 4 enclosed alcohol pads. Let dry.  Apply patch as indicated in monitor instructions. Patch will be placed under collarbone on left  side of chest with arrow pointing upward.  Rub patch adhesive wings for 2 minutes. Remove white label marked "1". Remove the white  label marked "2". Rub patch adhesive wings for 2 additional minutes.  While looking in a mirror, press and release button in center of patch. A small green light will  flash 3-4 times. This will be your only indicator that the monitor has been turned on.  Do not shower for the first 24  hours. You may shower after the first 24 hours.  Press the button if you feel a symptom. You will hear a small click. Record Date, Time and  Symptom in the Patient Logbook.  When you are ready to remove the patch, follow instructions on the last 2 pages of Patient  Logbook. Stick patch monitor onto the last page of Patient Logbook.  Place Patient Logbook in the blue and white box. Use locking tab on box and tape box closed  securely. The blue and white box has prepaid postage on it. Please place it in the mailbox as  soon as possible. Your physician should have your test results approximately 7 days after the  monitor has been mailed back to [facility].  Call [facility] Customer Care at [phone] if you have questions regarding  your ZIO XT patch monitor. Call them immediately if you see an orange light blinking on your  monitor.  If your monitor falls off in less than 4 days, contact our Monitor department at [phone].  If your monitor becomes loose or falls off after 4 days call [employer] at [phone] for  suggestions on securing your monitor     Follow-Up: At [facility], you and your health needs are our priority.  As part of our continuing mission to provide you with exceptional heart care, we have created designated Provider Care Teams.  These Care Teams include your primary Cardiologist (physician) and Advanced Practice Providers (APPs -  Physician Assistants and Nurse Practitioners) who all work together to provide you with the care you need, when you need it.  We recommend signing up for the patient portal called "MyChart".  Sign up information is provided on this After Visit Summary.  MyChart is used to connect with patients for Virtual Visits (Telemedicine).  Patients are able to view lab/test results, encounter  notes, upcoming appointments, etc.  Non-urgent messages can be sent to your provider as well.   To learn more about what you can do with MyChart,  go to [email or web address].    Your next appointment:   14 week(s)  The format for your next appointment:   In Person  Provider:   Thomasene Ripple, DO  [address] [address] [place] [address]   Dispo:  Return in about 14 weeks (around 05/23/2022).   Medication Adjustments/Labs and Tests Ordered: Current medicines are reviewed at length with the patient today.  Concerns regarding medicines are outlined above.  Tests Ordered: Orders Placed This Encounter  Procedures   LONG TERM MONITOR (3-14 DAYS)   EKG 12-Lead   ECHOCARDIOGRAM COMPLETE   Medication Changes: No orders of the defined types were placed in this encounter.

## 2022-02-14 NOTE — Patient Instructions (Signed)
Medication Instructions:  Your physician recommends that you continue on your current medications as directed. Please refer to the Current Medication list given to you today.  *If you need a refill on your cardiac medications before your next appointment, please call your pharmacy*   Lab Work: NONE If you have labs (blood work) drawn today and your tests are completely normal, you will receive your results only by: [place] (if you have MyChart) OR A paper copy in the mail If you have any lab test that is abnormal or we need to change your treatment, we will call you to review the results.   Testing/Procedures: Your physician has requested that you have an echocardiogram. Echocardiography is a painless test that uses sound waves to create images of your heart. It provides your doctor with information about the size and shape of your heart and how well your heart's chambers and valves are working. This procedure takes approximately one hour. There are no restrictions for this procedure. Please do NOT wear cologne, perfume, aftershave, or lotions (deodorant is allowed). Please arrive 15 minutes prior to your appointment time.  ZIO XT- Long Term Monitor Instructions  Your physician has requested you wear a ZIO patch monitor for 7 days.  This is a single patch monitor. [employer] supplies one patch monitor per enrollment. Additional stickers are not available. Please do not apply patch if you will be having a Nuclear Stress Test,  Echocardiogram, Cardiac CT, MRI, or Chest Xray during the period you would be wearing the  monitor. The patch cannot be worn during these tests. You cannot remove and re-apply the  ZIO XT patch monitor.  Your ZIO patch monitor will be mailed 3 day USPS to your address on file. It may take 3-5 days  to receive your monitor after you have been enrolled.  Once you have received your monitor, please review the enclosed instructions. Your monitor  has already been  registered assigning a specific monitor serial # to you.  Billing and Patient Assistance Program Information  We have supplied [employer] with any of your insurance information on file for billing purposes. [employer] offers a sliding scale Patient Assistance Program for patients that do not have  insurance, or whose insurance does not completely cover the cost of the ZIO monitor.  You must apply for the Patient Assistance Program to qualify for this discounted rate.  To apply, please call [employer] at [phone], select option 4, select option 2, ask to apply for  Patient Assistance Program. Theodore Demark will ask your household income, and how many people  are in your household. They will quote your out-of-pocket cost based on that information.  [employer] will also be able to set up a 22-month interest-free payment plan if needed.  Applying the monitor   Shave hair from upper left chest.  Hold abrader disc by orange tab. Rub abrader in 40 strokes over the upper left chest as  indicated in your monitor instructions.  Clean area with 4 enclosed alcohol pads. Let dry.  Apply patch as indicated in monitor instructions. Patch will be placed under collarbone on left  side of chest with arrow pointing upward.  Rub patch adhesive wings for 2 minutes. Remove white label marked "1". Remove the white  label marked "2". Rub patch adhesive wings for 2 additional minutes.  While looking in a mirror, press and release button in center of patch. A small green light will  flash 3-4 times. This will be your only indicator that  the monitor has been turned on.  Do not shower for the first 24 hours. You may shower after the first 24 hours.  Press the button if you feel a symptom. You will hear a small click. Record Date, Time and  Symptom in the Patient Logbook.  When you are ready to remove the patch, follow instructions on the last 2 pages of Patient  Logbook. Stick patch monitor onto the last page of Patient  Logbook.  Place Patient Logbook in the blue and white box. Use locking tab on box and tape box closed  securely. The blue and white box has prepaid postage on it. Please place it in the mailbox as  soon as possible. Your physician should have your test results approximately 7 days after the  monitor has been mailed back to [facility].  Call [facility] at [phone] if you have questions regarding  your ZIO XT patch monitor. Call them immediately if you see an orange light blinking on your  monitor.  If your monitor falls off in less than 4 days, contact our Monitor department at [phone].  If your monitor becomes loose or falls off after 4 days call [employer] at [phone] for  suggestions on securing your monitor     Follow-Up: At [facility], you and your health needs are our priority.  As part of our continuing mission to provide you with exceptional heart care, we have created designated Provider Care Teams.  These Care Teams include your primary Cardiologist (physician) and Advanced Practice Providers (APPs -  Physician Assistants and Nurse Practitioners) who all work together to provide you with the care you need, when you need it.  We recommend signing up for the patient portal called "MyChart".  Sign up information is provided on this After Visit Summary.  MyChart is used to connect with patients for Virtual Visits (Telemedicine).  Patients are able to view lab/test results, encounter notes, upcoming appointments, etc.  Non-urgent messages can be sent to your provider as well.   To learn more about what you can do with MyChart, go to [email or web address].    Your next appointment:   14 week(s)  The format for your next appointment:   In Person  Provider:   Berniece Salines, DO  [place]

## 2022-02-14 NOTE — Progress Notes (Unsigned)
Cardio-Obstetrics Clinic  {Choose New Eval or Follow Up Note:[phone]}   Prior CV Studies Reviewed: The following studies were reviewed today: ***  Past Medical History:  Diagnosis Date   No known health problems     Past Surgical History:  Procedure Laterality Date   NO PAST SURGERIES     WISDOM TOOTH EXTRACTION     { Click here to update PMH, PSH, OB Hx then refresh note  :1}   OB History     Gravida  4   Para  1   Term  1   Preterm  0   AB  2   Living  1      SAB  2   IAB  0   Ectopic  0   Multiple  0   Live Births  1           { Click here to update OB Charting then refresh note  :1}    Current Medications: No outpatient medications have been marked as taking for the 02/14/22 encounter (Office Visit) with Thomasene Ripple, DO.     Allergies:   Patient has no known allergies.   Social History   Socioeconomic History   Marital status: Married    Spouse name: Not on file   Number of children: 0   Years of education: Not on file   Highest education level: Not on file  Occupational History   Not on file  Tobacco Use   Smoking status: Never    Passive exposure: Never   Smokeless tobacco: Never  Vaping Use   Vaping Use: Never used  Substance and Sexual Activity   Alcohol use: Not Currently    Comment: social   Drug use: No   Sexual activity: Yes    Partners: Male    Birth control/protection: None  Other Topics Concern   Not on file  Social History Narrative   Not on file   Social Determinants of Health   Financial Resource Strain: Not on file  Food Insecurity: Not on file  Transportation Needs: Not on file  Physical Activity: Not on file  Stress: Not on file  Social Connections: Not on file  { Click here to update SDOH then refresh :1}    Family History  Problem Relation Age of Onset   Stroke Father    Epilepsy Father    Cancer Neg Hx    Heart disease Neg Hx    { Click here to update FH then refresh note    :1}    ROS:   Please see the history of present illness.    *** All other systems reviewed and are negative.   Labs/EKG Reviewed:    EKG:   EKG is *** ordered today.  The ekg ordered today demonstrates ***  Recent Labs: 07/02/2021: ALT 15; BUN 10; Creatinine, Ser 0.66; Potassium 4.0; Sodium 139 01/10/2022: Hemoglobin 14.0; Platelets 259   Recent Lipid Panel Lab Results  Component Value Date/Time   CHOL 151 07/02/2021 09:17 AM   TRIG 43.0 07/02/2021 09:17 AM   HDL 68.30 07/02/2021 09:17 AM   CHOLHDL 2 07/02/2021 09:17 AM   LDLCALC 74 07/02/2021 09:17 AM    Physical Exam:    VS:  BP 106/76   Pulse 62   Ht 5\' 6"  (1.676 m)   Wt 142 lb 6.4 oz (64.6 kg)   LMP 10/29/2021   SpO2 94%   BMI 22.98 kg/m     Wt Readings from Last  3 Encounters:  02/14/22 142 lb 6.4 oz (64.6 kg)  02/06/22 140 lb (63.5 kg)  01/10/22 136 lb (61.7 kg)     GEN: *** Well nourished, well developed in no acute distress HEENT: Normal NECK: No JVD; No carotid bruits LYMPHATICS: No lymphadenopathy CARDIAC: ***RRR, no murmurs, rubs, gallops RESPIRATORY:  Clear to auscultation without rales, wheezing or rhonchi  ABDOMEN: Soft, non-tender, non-distended MUSCULOSKELETAL:  No edema; No deformity  SKIN: Warm and dry NEUROLOGIC:  Alert and oriented x 3 PSYCHIATRIC:  Normal affect    Risk Assessment/Risk Calculators:   { Click to calculate CARPREG II - THEN refresh note :1}    { Click to caclulate Mod WHO Class of CV Risk - THEN refresh note :1}     { Click for [id number] Score - THEN Refresh Note    :[id number]}      ASSESSMENT & PLAN:    *** There are no Patient Instructions on file for this visit.   Dispo:  No follow-ups on file.   Medication Adjustments/Labs and Tests Ordered: Current medicines are reviewed at length with the patient today.  Concerns regarding medicines are outlined above.  Tests Ordered: No orders of the defined types were placed in this encounter.  Medication  Changes: No orders of the defined types were placed in this encounter.

## 2022-02-28 ENCOUNTER — Ambulatory Visit (HOSPITAL_COMMUNITY): Payer: BC Managed Care – PPO | Attending: Internal Medicine

## 2022-02-28 DIAGNOSIS — R0602 Shortness of breath: Secondary | ICD-10-CM | POA: Insufficient documentation

## 2022-02-28 LAB — ECHOCARDIOGRAM COMPLETE
Area-P 1/2: 4.15 cm2
S' Lateral: 2.6 cm

## 2022-03-11 ENCOUNTER — Encounter: Payer: Self-pay | Admitting: *Deleted

## 2022-03-11 ENCOUNTER — Ambulatory Visit: Payer: BC Managed Care – PPO | Attending: Obstetrics and Gynecology

## 2022-03-11 ENCOUNTER — Ambulatory Visit: Payer: BC Managed Care – PPO | Admitting: *Deleted

## 2022-03-11 VITALS — BP 106/67 | HR 72

## 2022-03-11 DIAGNOSIS — Z363 Encounter for antenatal screening for malformations: Secondary | ICD-10-CM | POA: Diagnosis not present

## 2022-03-11 DIAGNOSIS — O09529 Supervision of elderly multigravida, unspecified trimester: Secondary | ICD-10-CM

## 2022-03-11 DIAGNOSIS — Z3A19 19 weeks gestation of pregnancy: Secondary | ICD-10-CM | POA: Insufficient documentation

## 2022-03-11 DIAGNOSIS — O09522 Supervision of elderly multigravida, second trimester: Secondary | ICD-10-CM | POA: Insufficient documentation

## 2022-03-11 DIAGNOSIS — Z3480 Encounter for supervision of other normal pregnancy, unspecified trimester: Secondary | ICD-10-CM | POA: Insufficient documentation

## 2022-03-12 ENCOUNTER — Other Ambulatory Visit: Payer: Self-pay | Admitting: *Deleted

## 2022-03-12 DIAGNOSIS — O09522 Supervision of elderly multigravida, second trimester: Secondary | ICD-10-CM

## 2022-03-12 DIAGNOSIS — Z3689 Encounter for other specified antenatal screening: Secondary | ICD-10-CM

## 2022-03-18 ENCOUNTER — Ambulatory Visit (INDEPENDENT_AMBULATORY_CARE_PROVIDER_SITE_OTHER): Payer: BC Managed Care – PPO | Admitting: Obstetrics and Gynecology

## 2022-03-18 VITALS — BP 109/70 | HR 70 | Wt 147.0 lb

## 2022-03-18 DIAGNOSIS — Z3A20 20 weeks gestation of pregnancy: Secondary | ICD-10-CM

## 2022-03-18 DIAGNOSIS — Z3482 Encounter for supervision of other normal pregnancy, second trimester: Secondary | ICD-10-CM

## 2022-03-18 DIAGNOSIS — Z3480 Encounter for supervision of other normal pregnancy, unspecified trimester: Secondary | ICD-10-CM

## 2022-03-18 NOTE — Progress Notes (Signed)
   PRENATAL VISIT NOTE  Subjective:  Brandi Murray is a 36 y.o. [phone] at [redacted]w[redacted]d being seen today for ongoing prenatal care.  She is currently monitored for the following issues for this low-risk pregnancy and has Supervision of other normal pregnancy, antepartum and AMA (advanced maternal age) multigravida 35+, unspecified trimester on their problem list.  Patient reports no complaints.  Contractions: Not present. Vag. Bleeding: None.  Movement: Present. Denies leaking of fluid.   The following portions of the patient's history were reviewed and updated as appropriate: allergies, current medications, past family history, past medical history, past social history, past surgical history and problem list.   Objective:   Vitals:   03/18/22 0933  BP: 109/70  Pulse: 70  Weight: 147 lb (66.7 kg)    Fetal Status: Fetal Heart Rate (bpm): 151   Movement: Present     General:  Alert, oriented and cooperative. Patient is in no acute distress.  Skin: Skin is warm and dry. No rash noted.   Cardiovascular: Normal heart rate noted  Respiratory: Normal respiratory effort, no problems with respiration noted  Abdomen: Soft, gravid, appropriate for gestational age.  Pain/Pressure: Absent     Pelvic: Cervical exam deferred        Extremities: Normal range of motion.  Edema: None  Mental Status: Normal mood and affect. Normal behavior. Normal judgment and thought content.   Assessment and Plan:  Pregnancy: G4P1021 at [redacted]w[redacted]d 1. Supervision of other normal pregnancy, antepartum  - Alpha fetoprotein, maternal - Had some elevated BP's in previous pregnancy PP only. - Will have her start BASA  - Reports good fetal movement - Scheduled for repeat US for growth   Preterm labor symptoms and general obstetric precautions including but not limited to vaginal bleeding, contractions, leaking of fluid and fetal movement were reviewed in detail with the patient. Please refer to After Visit Summary for other  counseling recommendations.   No follow-ups on file.  Future Appointments  Date Time Provider Department Center  04/17/2022  3:15 PM [facility] NURSE [place] [facility]  04/17/2022  3:30 PM [facility] US3 [facility]  05/23/2022 10:40 AM Tobb, [place]ona [place], DO [facility] None    Venia Carbon, NP

## 2022-03-21 LAB — ALPHA FETOPROTEIN, MATERNAL
AFP MoM: 0.99
AFP, Serum: 56.8 ng/mL
Calc'd Gestational Age: 20 weeks
Maternal Wt: 148 [lb_av]
Risk for ONTD: 1
Twins-AFP: 1

## 2022-04-15 ENCOUNTER — Telehealth (INDEPENDENT_AMBULATORY_CARE_PROVIDER_SITE_OTHER): Payer: BC Managed Care – PPO

## 2022-04-15 VITALS — BP 113/67 | HR 76 | Wt 153.0 lb

## 2022-04-15 DIAGNOSIS — Z3A24 24 weeks gestation of pregnancy: Secondary | ICD-10-CM

## 2022-04-15 DIAGNOSIS — Z3480 Encounter for supervision of other normal pregnancy, unspecified trimester: Secondary | ICD-10-CM

## 2022-04-15 NOTE — Progress Notes (Signed)
   OBSTETRICS PRENATAL VIRTUAL VISIT ENCOUNTER NOTE  Provider location: Center for [facility] [place]   Patient location: Home  I connected with Devra Dopp on 04/15/22 at 10:10 AM EST by MyChart Video Encounter and verified that I am speaking with the correct person using two identifiers. I discussed the limitations, risks, security and privacy concerns of performing an evaluation and management service virtually and the availability of in person appointments. I also discussed with the patient that there may be a patient responsible charge related to this service. The patient expressed understanding and agreed to proceed. Subjective:  Brandi Murray is a 36 y.o. [phone] at [redacted]w[redacted]d being seen today for ongoing prenatal care.  She is currently monitored for the following issues for this low-risk pregnancy and has Supervision of other normal pregnancy, antepartum and AMA (advanced maternal age) multigravida 35, unspecified trimester on their problem list.  Patient reports no complaints.  Contractions: Not present. Vag. Bleeding: None.  Movement: Present. Denies any leaking of fluid.   The following portions of the patient's history were reviewed and updated as appropriate: allergies, current medications, past family history, past medical history, past social history, past surgical history and problem list.   Objective:   Vitals:   04/15/22 0958  BP: 113/67  Pulse: 76  Weight: 153 lb (69.4 kg)    Fetal Status:     Movement: Present     General:  Alert, oriented and cooperative. Patient is in no acute distress.  Respiratory: Normal respiratory effort, no problems with respiration noted  Mental Status: Normal mood and affect. Normal behavior. Normal judgment and thought content.  Rest of physical exam deferred due to type of encounter  Imaging: No results found.  Assessment and Plan:  Pregnancy: G4P1021 at [redacted]w[redacted]d 1. Supervision of other normal pregnancy, antepartum -  Routine OB. Doing well, no concerns - Reports spotting after IC several days ago, none since- reassurance provided - Anticipatory guidance for upcoming appointments provided. GTT and labs next visit  2. [redacted] weeks gestation of pregnancy - Endorses active fetal movement   Preterm labor symptoms and general obstetric precautions including but not limited to vaginal bleeding, contractions, leaking of fluid and fetal movement were reviewed in detail with the patient. I discussed the assessment and treatment plan with the patient. The patient was provided an opportunity to ask questions and all were answered. The patient agreed with the plan and demonstrated an understanding of the instructions. The patient was advised to call back or seek an in-person office evaluation/go to [facility]U at [facility] for any urgent or concerning symptoms. Please refer to After Visit Summary for other counseling recommendations.   I provided 7 minutes of face-to-face time during this encounter.  Return in about 4 weeks (around 05/13/2022) for ROB, GTT and labs.  Future Appointments  Date Time Provider Department Center  04/17/2022  3:15 PM [facility] NURSE [facility]  04/17/2022  3:30 PM [facility] US3 [facility]  05/23/2022 10:40 AM Tobb, Lavona [place], DO [facility] None    Brand Males, CNM Center for [employer], [facility]

## 2022-04-17 ENCOUNTER — Ambulatory Visit: Payer: BC Managed Care – PPO | Admitting: *Deleted

## 2022-04-17 ENCOUNTER — Ambulatory Visit: Payer: BC Managed Care – PPO | Attending: Obstetrics and Gynecology

## 2022-04-17 VITALS — BP 114/63 | HR 74

## 2022-04-17 DIAGNOSIS — O09522 Supervision of elderly multigravida, second trimester: Secondary | ICD-10-CM | POA: Diagnosis not present

## 2022-04-17 DIAGNOSIS — Z3A24 24 weeks gestation of pregnancy: Secondary | ICD-10-CM | POA: Diagnosis not present

## 2022-04-17 DIAGNOSIS — Z3689 Encounter for other specified antenatal screening: Secondary | ICD-10-CM | POA: Insufficient documentation

## 2022-04-17 DIAGNOSIS — Z362 Encounter for other antenatal screening follow-up: Secondary | ICD-10-CM | POA: Insufficient documentation

## 2022-04-17 DIAGNOSIS — Z3480 Encounter for supervision of other normal pregnancy, unspecified trimester: Secondary | ICD-10-CM

## 2022-04-17 DIAGNOSIS — O321XX0 Maternal care for breech presentation, not applicable or unspecified: Secondary | ICD-10-CM | POA: Diagnosis not present

## 2022-04-18 ENCOUNTER — Other Ambulatory Visit: Payer: Self-pay | Admitting: *Deleted

## 2022-04-18 DIAGNOSIS — O09522 Supervision of elderly multigravida, second trimester: Secondary | ICD-10-CM

## 2022-04-28 NOTE — L&D Delivery Note (Signed)
Delivery Note ROM x 1 hr 23 min. At 4:53 AM a viable, healthy, and vigorous  female was delivered via Vaginal, Spontaneous (Presentation: Right Occiput Anterior).  Shoulders delivered easily. Infant placed skin-to-skin w/ mom. Delayed cord clamping x 1 minute. Cord clamped x 2 and cut by FOB. APGAR: 9, 9; weight  pending.   Placenta status: Spontaneous, Intact.  Cord: 3 vessels with the following complications: None.  Cord pH: NA  Anesthesia: None Episiotomy: None Lacerations: 1st degree. Hemostatic, no repair Suture Repair:  NA Est. Blood Loss (mL):  50 ml  Mom to postpartum.  Baby to Couplet care / Skin to Skin. Placenta to: LD Feeding: Breast Circ: IP Contraception: Vasectomy done in December   Brandi Murray [place] 07/28/2022, 5:25 AM

## 2022-05-12 ENCOUNTER — Ambulatory Visit (INDEPENDENT_AMBULATORY_CARE_PROVIDER_SITE_OTHER): Payer: BC Managed Care – PPO | Admitting: Obstetrics & Gynecology

## 2022-05-12 ENCOUNTER — Encounter: Payer: Self-pay | Admitting: Obstetrics & Gynecology

## 2022-05-12 VITALS — BP 109/69 | HR 84 | Wt 159.0 lb

## 2022-05-12 DIAGNOSIS — Z3480 Encounter for supervision of other normal pregnancy, unspecified trimester: Secondary | ICD-10-CM | POA: Diagnosis not present

## 2022-05-12 DIAGNOSIS — Z3A27 27 weeks gestation of pregnancy: Secondary | ICD-10-CM

## 2022-05-12 DIAGNOSIS — Z3482 Encounter for supervision of other normal pregnancy, second trimester: Secondary | ICD-10-CM

## 2022-05-12 NOTE — Progress Notes (Signed)
Pt declined  Tdap

## 2022-05-12 NOTE — Progress Notes (Signed)
  LOW-RISK PREGNANCY VISIT Patient name: Brandi Murray MRN [id number]  Date of birth: 12/22/1985 Chief Complaint:   Routine Prenatal Visit  History of Present Illness:   Brandi Murray is a 37 y.o. [id number] female at [redacted]w[redacted]d with an Estimated Date of Delivery: 08/05/22 being seen today for ongoing management of a low-risk pregnancy.      01/10/2022    9:13 AM 07/02/2021    9:00 AM 08/23/2020    9:15 AM 10/14/2019    9:23 AM  Depression screen PHQ 2/9  Decreased Interest 0 0 2 0  Down, Depressed, Hopeless 0 0 0 0  PHQ - 2 Score 0 0 2 0  Altered sleeping 0  3   Tired, decreased energy 1  3   Change in appetite 1  3   Feeling bad or failure about yourself  0  0   Trouble concentrating 0  0   Moving slowly or fidgety/restless 0  0   Suicidal thoughts 0  0   PHQ-9 Score 2  11     Today she reports  occasional heartburn after spicy food . Contractions: Not present. Vag. Bleeding: None.  Movement: Present. denies leaking of fluid. Review of Systems:   Pertinent items are noted in HPI Denies abnormal vaginal discharge w/ itching/odor/irritation, headaches, visual changes, shortness of breath, chest pain, abdominal pain, severe nausea/vomiting, or problems with urination or bowel movements unless otherwise stated above. Pertinent History Reviewed:  Reviewed past medical,surgical, social, obstetrical and family history.  Reviewed problem list, medications and allergies.  Physical Assessment:   Vitals:   05/12/22 1128  BP: 109/69  Pulse: 84  Weight: 159 lb (72.1 kg)  Body mass index is 25.66 kg/m.        Physical Examination:   General appearance: Well appearing, and in no distress  Mental status: Alert, oriented to person, place, and time  Skin: Warm & dry  Respiratory: Normal respiratory effort, no distress  Abdomen: Soft, gravid, nontender  Pelvic: Cervical exam deferred         Extremities: Edema: None  Psych:  mood and affect appropriate  Fetal Status: Fetal Heart Rate (bpm):  150 Fundal Height: 26 cm Movement: Present    Chaperone: n/a    No results found for this or any previous visit (from the past 24 hour(s)).   Assessment & Plan:  1) Low-risk pregnancy [id number] at [redacted]w[redacted]d with an Estimated Date of Delivery: 08/05/22   Per pt MFM recommended f/u for fetal presentation- she is questioning whether this is necessary.  Discussed that likely baby will move on its own.  [place] not "mandatory" if there is any question about position later in pregnancy can reconsider [place]   Meds: No orders of the defined types were placed in this encounter.  Labs/procedures today: 2hr GTT  Plan:  Continue routine obstetrical care  Next visit: prefers in person    Reviewed: Preterm labor symptoms and general obstetric precautions including but not limited to vaginal bleeding, contractions, leaking of fluid and fetal movement were reviewed in detail with the patient.  All questions were answered.   Follow-up: Return in about 2 weeks (around 05/26/2022) for 2-3wk LROB.  Orders Placed This Encounter  Procedures   Glucose Tolerance, 2 Hours w/1 Hour   HIV antibody (with reflex)   CBC   RPR    Janyth Pupa, DO Attending [place], [place] for [employer], [place]

## 2022-05-13 ENCOUNTER — Encounter: Payer: BC Managed Care – PPO | Admitting: Obstetrics and Gynecology

## 2022-05-13 LAB — GLUCOSE TOLERANCE, 2 HOURS W/ 1HR
Glucose, 1 hour: 138 mg/dL (ref 70–179)
Glucose, 2 hour: 128 mg/dL (ref 70–152)
Glucose, Fasting: 75 mg/dL (ref 70–91)

## 2022-05-13 LAB — RPR: RPR Ser Ql: NONREACTIVE

## 2022-05-13 LAB — CBC
Hematocrit: 38.5 % (ref 34.0–46.6)
Hemoglobin: 12.8 g/dL (ref 11.1–15.9)
MCH: 32.2 pg (ref 26.6–33.0)
MCHC: 33.2 g/dL (ref 31.5–35.7)
MCV: 97 fL (ref 79–97)
Platelets: 253 10*3/uL (ref 150–450)
RBC: 3.97 x10E6/uL (ref 3.77–5.28)
RDW: 11.7 % (ref 11.7–15.4)
WBC: 8 10*3/uL (ref 3.4–10.8)

## 2022-05-13 LAB — HIV ANTIBODY (ROUTINE TESTING W REFLEX): HIV Screen 4th Generation wRfx: NONREACTIVE

## 2022-05-23 ENCOUNTER — Ambulatory Visit: Payer: BC Managed Care – PPO | Admitting: Cardiology

## 2022-05-26 ENCOUNTER — Encounter: Payer: BC Managed Care – PPO | Admitting: Obstetrics and Gynecology

## 2022-05-28 DIAGNOSIS — M5489 Other dorsalgia: Secondary | ICD-10-CM | POA: Diagnosis not present

## 2022-05-29 ENCOUNTER — Ambulatory Visit (INDEPENDENT_AMBULATORY_CARE_PROVIDER_SITE_OTHER): Payer: BC Managed Care – PPO | Admitting: Obstetrics and Gynecology

## 2022-05-29 ENCOUNTER — Encounter: Payer: Self-pay | Admitting: Obstetrics and Gynecology

## 2022-05-29 VITALS — BP 104/67 | HR 80 | Wt 162.0 lb

## 2022-05-29 DIAGNOSIS — O09523 Supervision of elderly multigravida, third trimester: Secondary | ICD-10-CM

## 2022-05-29 DIAGNOSIS — Z3A30 30 weeks gestation of pregnancy: Secondary | ICD-10-CM

## 2022-05-29 DIAGNOSIS — Z3480 Encounter for supervision of other normal pregnancy, unspecified trimester: Secondary | ICD-10-CM

## 2022-05-29 DIAGNOSIS — O09529 Supervision of elderly multigravida, unspecified trimester: Secondary | ICD-10-CM

## 2022-05-29 NOTE — Progress Notes (Signed)
   PRENATAL VISIT NOTE  Subjective:  Brandi Murray is a 37 y.o. [phone] at [redacted]w[redacted]d being seen today for ongoing prenatal care.  She is currently monitored for the following issues for this low-risk pregnancy and has Supervision of other normal pregnancy, antepartum and AMA (advanced maternal age) multigravida 35+, unspecified trimester on their problem list.  Patient reports no complaints.  Contractions: Not present. Vag. Bleeding: None.  Movement: Present. Denies leaking of fluid.   The following portions of the patient's history were reviewed and updated as appropriate: allergies, current medications, past family history, past medical history, past social history, past surgical history and problem list.   Objective:   Vitals:   05/29/22 1516  BP: 104/67  Pulse: 80  Weight: 162 lb (73.5 kg)    Fetal Status: Fetal Heart Rate (bpm): 148   Movement: Present     General:  Alert, oriented and cooperative. Patient is in no acute distress.  Skin: Skin is warm and dry. No rash noted.   Cardiovascular: Normal heart rate noted  Respiratory: Normal respiratory effort, no problems with respiration noted  Abdomen: Soft, gravid, appropriate for gestational age.  Pain/Pressure: Absent     Pelvic: Cervical exam deferred        Extremities: Normal range of motion.     Mental Status: Normal mood and affect. Normal behavior. Normal judgment and thought content.   Assessment and Plan:  Pregnancy: G4P1021 at [redacted]w[redacted]d 1. Supervision of other normal pregnancy, antepartum Patient is doing well without complaints Reviewed normal glucola results  2. AMA (advanced maternal age) multigravida 78+, unspecified trimester   Preterm labor symptoms and general obstetric precautions including but not limited to vaginal bleeding, contractions, leaking of fluid and fetal movement were reviewed in detail with the patient. Please refer to After Visit Summary for other counseling recommendations.   Return in about 2  weeks (around 06/12/2022) for in person, ROB, Low risk.  Future Appointments  Date Time Provider [place]  05/29/2022  3:30 PM Oliva Montecalvo, Vickii Chafe, MD [facility]    Mora Bellman, MD

## 2022-05-30 DIAGNOSIS — R609 Edema, unspecified: Secondary | ICD-10-CM | POA: Diagnosis not present

## 2022-05-30 IMAGING — US US MFM OB COMP +14 WKS
1 series · 12 of 28 positions shown · non-contrast
Comparison: none

[Series 1: us mfm ob comp +14 wks · 135 acquisitions, 12 frames shown]
[im 5/135]
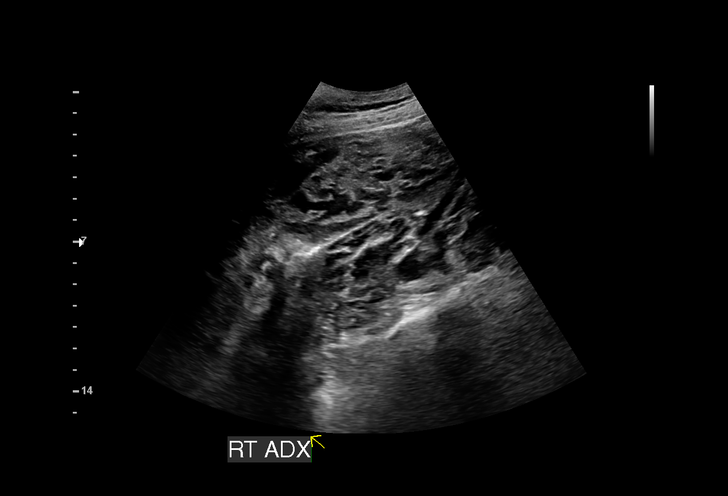
[im 15/135]
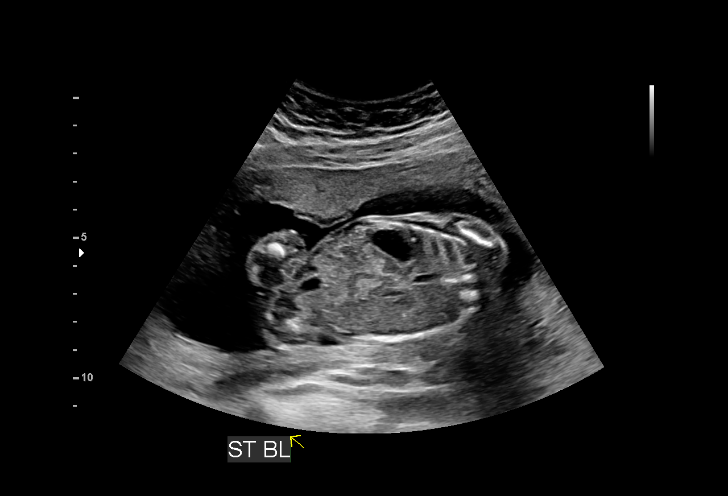
[im 25/135]
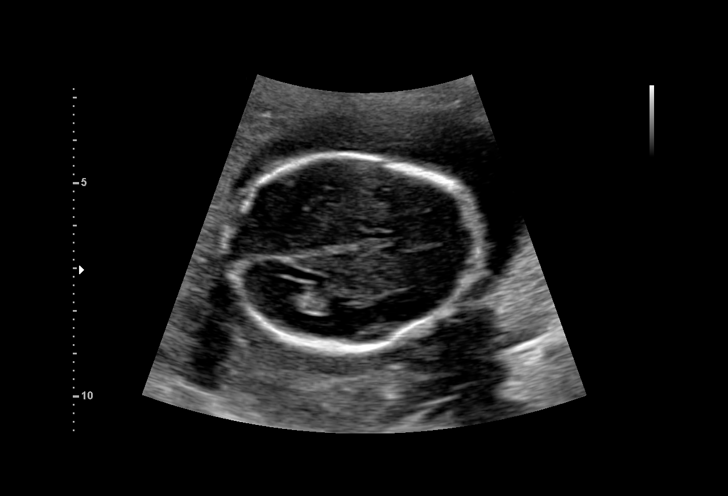
[im 40/135]
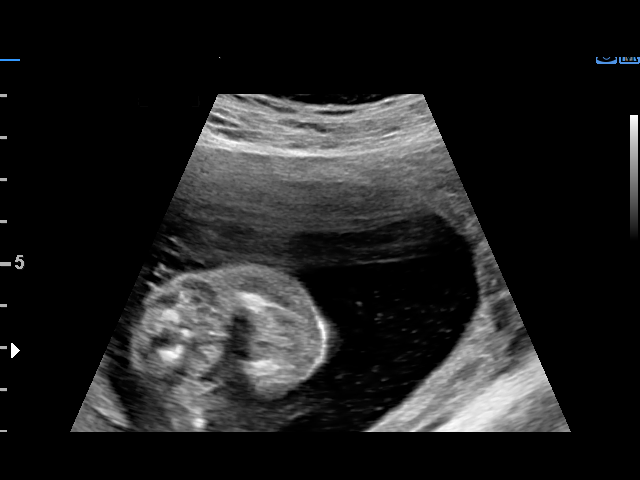
[im 50/135]
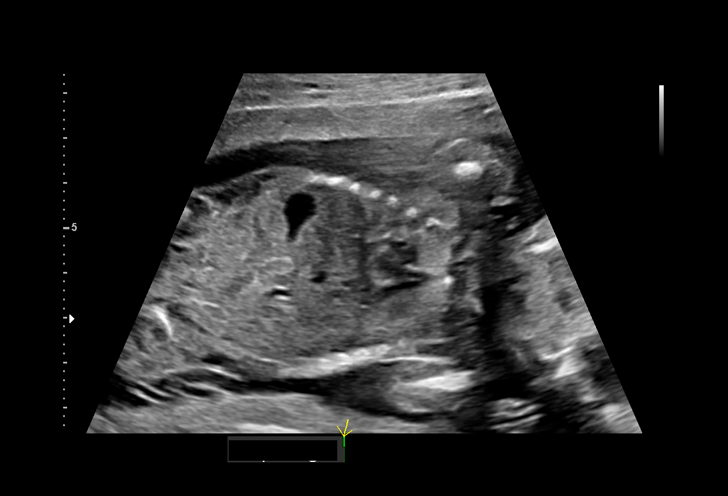
[im 60/135]
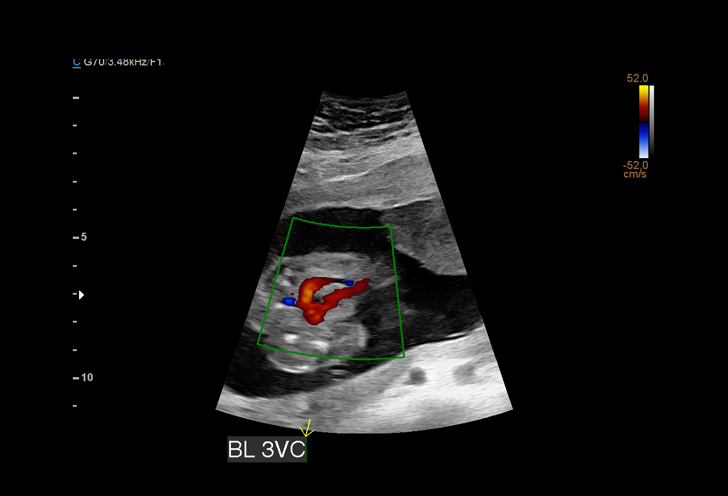
[im 75/135]
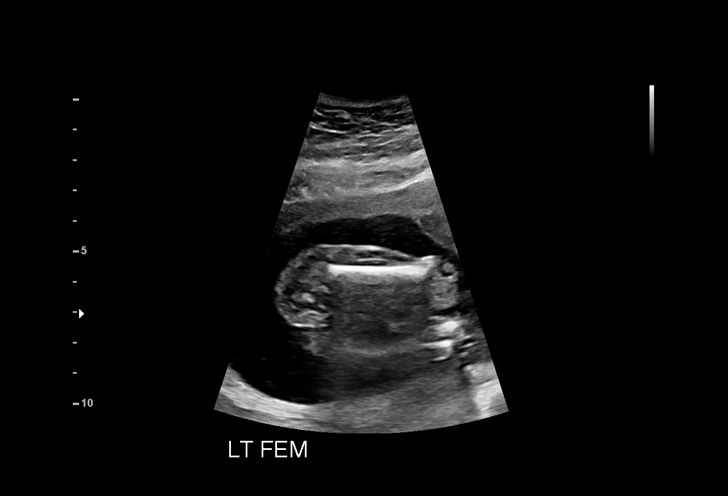
[im 85/135]
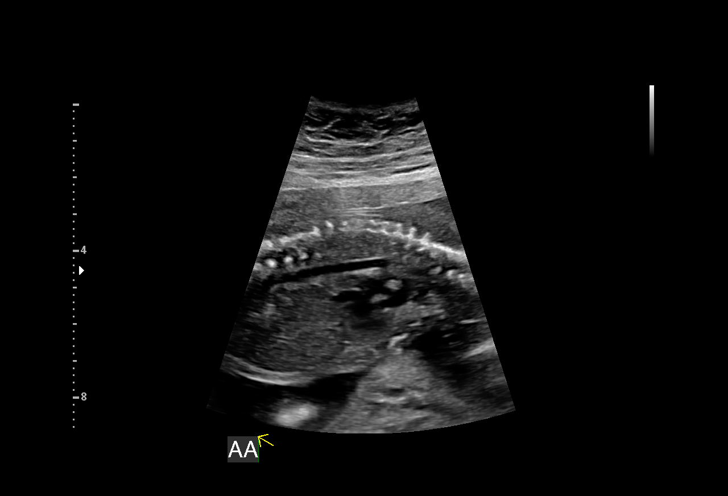
[im 95/135]
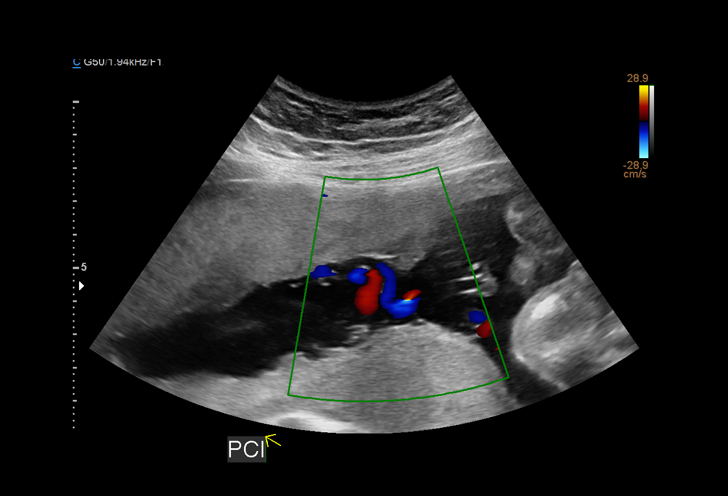
[im 110/135]
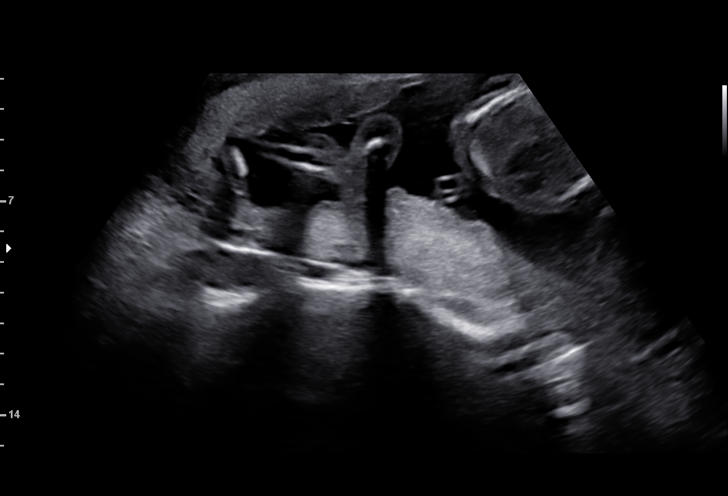
[im 120/135]
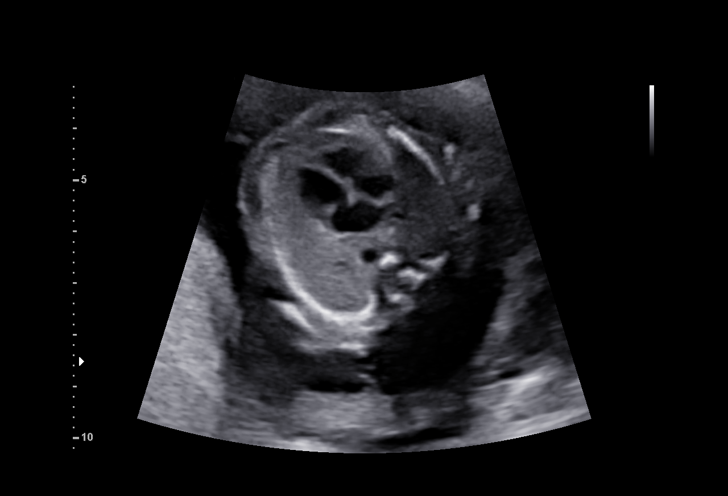
[im 130/135]
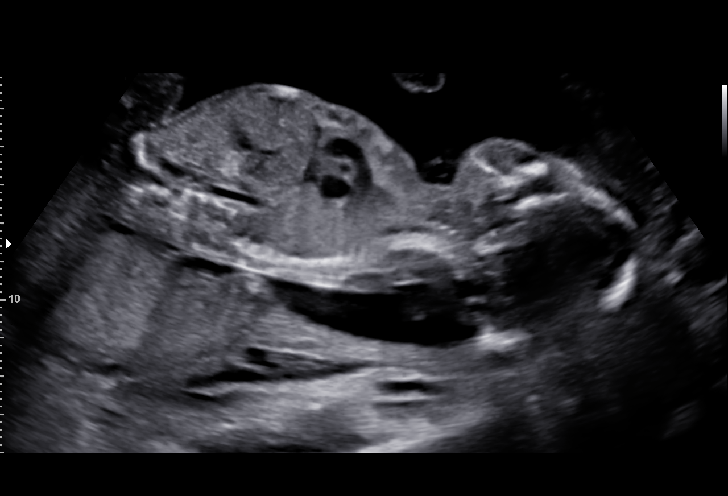

[12 of 28 positions shown; findings below may reference images not displayed]

JADUE

                                                      DAISE

Indications

 Fetal abnormality - other known or suspected
 19 weeks gestation of pregnancy
 LR Female
 Neg Horizon
 Neg AFP
 SAB x2
 Encounter for antenatal screening for
 malformations
Fetal Evaluation

 Num Of Fetuses:         1
 Preg. Location:         Intrauterine
 Fetal Heart Rate(bpm):  147
 Cardiac Activity:       Observed
 Presentation:           Cephalic
 Placenta:               Fundal
 P. Cord Insertion:      Visualized

 Amniotic Fluid
 AFI FV:      Within normal limits

                             Largest Pocket(cm)

Biometry
 BPD:      45.5  mm     G. Age:  19w 5d         58  %    CI:        69.51   %    70 - 86
                                                         FL/HC:      19.2   %    16.8 -
 HC:      174.2  mm     G. Age:  20w 0d         61  %    HC/AC:      1.12        1.09 -
 AC:      155.7  mm     G. Age:  20w 5d         81  %    FL/BPD:     73.4   %
 FL:       33.4  mm     G. Age:  20w 3d         74  %    FL/AC:      21.5   %    20 - 24
 HUM:      31.1  mm     G. Age:  20w 2d         72  %
 CER:      21.2  mm     G. Age:  20w 1d         82  %
 NFT:       4.8  mm

 LV:        7.6  mm
 CM:        4.9  mm

 Est. FW:     359  gm    0 lb 13 oz      92  %
OB History

 Blood Type:   O+
 Gravidity:    3          SAB:   2
Gestational Age

 LMP:           19w 4d        Date:  06/15/20                 EDD:   03/22/21
 U/S Today:     20w 2d                                        EDD:   03/17/21
 Best:          19w 4d     Det. By:  LMP  (06/15/20)          EDD:   03/22/21
Anatomy

 Cranium:               Appears normal         LVOT:                   Limited Views
 Cavum:                 Appears normal         Aortic Arch:            Appears normal
 Ventricles:            Appears normal         Ductal Arch:            Appears normal
 Choroid Plexus:        Appears normal         Diaphragm:              Appears normal
 Cerebellum:            Appears normal         Stomach:                Appears normal, left
                                                                       sided
 Posterior Fossa:       Appears normal         Abdomen:                Appears normal
 Nuchal Fold:           Appears normal         Abdominal Wall:         Appears nml (cord
                                                                       insert, abd wall)
 Face:                  Appears normal         Cord Vessels:           Appears normal (3
                        (orbits and profile)                           vessel cord)
 Lips:                  Appears normal         Kidneys:                Appear normal
 Palate:                Limited Views          Bladder:                Appears normal
 Thoracic:              Appears normal         Spine:                  Appears normal
 Heart:                 Limited Views          Upper Extremities:      Visualized
 RVOT:                  Not well visualized    Lower Extremities:      Visualized
Targeted Anatomy

 Central Nervous System
 Calvarium/Cranial V.:  Appears normal         Choroid Plexus:         Appears normal
 Intracranial Arissa:     Appears normal         Cereb./Vermis:          Appears normal
 Cavum:                 Appears normal         Cisterna Magna:         Appears normal
 Parenchyma:            Appears normal         Midline Falx:           Appears normal
 Lateral Ventricles:    Appears normal

 Spine
 Cervical:              Appears normal         Sacral:                 Appears Normal
 Thoracic:              Appears normal         Shape/Curvature:        Appears normal
 Lumbar:                Appears normal
 Head/Neck
 Face:                  Appears normal         Palate:                 Limited Views
 Lips:                  Appears normal         Profile:                Appears normal
 Nuchal Fold:           Appears normal         Orbits/Eyes:            Appears normal
 Nasal Bone:            Present

 Thorax
 4 Chamber View:        Limited Views          Aortic Arch:            Appears normal
 Cardiac Activity:      Observed               Ductal Arch:            Appears normal
 Cardiac Rhythm:        Normal                 SVC:                    Appears normal
 Cardiac Situs:         Appears normal         Diaphragm:              Appears normal
 Lt Outflow Tract:      Limited Views          IVC:                    Appears normal

 Abdomen
 Ventral Wall:          Appears normal         Lt Kidney:              Appears normal
 Cord Insertion:        Appears normal         Rt Kidney:              Appears normal
 Situs:                 Within normal limits   Bladder:                Appears normal
 Stomach:               Appears normal

 Extremities
 Lt Humerus:            Appears normal         Lt Femur:               Appears normal
 Rt Humerus:            Appears normal         Rt Femur:               Appears normal
 Lt Forearm:            Appears normal         Lt Lower Leg:           Appears normal
 Rt Forearm:            Appears normal         Rt Lower Leg:           Appears normal
 Lt Hand:               Limited Views          Lt Foot:                Limited Views
 Rt Hand:               Limited Views          Rt Foot:                Limited Views

 Other
 Umbilical Cord:        Normal 3-vessel        Genitalia:              Female

 Comment:     Limited Views = seen but not able to clear
Cervix Uterus Adnexa

 Cervix
 Length:           3.77  cm.
 Normal appearance by transabdominal scan.

 Uterus
 No abnormality visualized.

 Right Ovary
 Not visualized.

 Left Ovary
 Not visualized.

 Cul De Sac
 No free fluid seen.

 Adnexa
 No abnormality visualized.
Comments

 This patient was seen for a detailed fetal anatomy scan.
 She denies any significant past medical history and denies
 any problems in her current pregnancy.
 She had a cell free DNA test earlier in her pregnancy which
 indicated a low risk for trisomy 21, 18, and 13. A female fetus
 is predicted.
 She was informed that the fetal growth and amniotic fluid
 level were appropriate for her gestational age.
 There were no obvious fetal anomalies noted on today's
 ultrasound exam.  However, the views of the fetal anatomy
 were limited today due to the fetal position.
 The patient was informed that anomalies may be missed due
 to technical limitations. If the fetus is in a suboptimal position
 or maternal habitus is increased, visualization of the fetus in
 the maternal uterus may be impaired.
 A follow-up exam was scheduled in 4 weeks to complete the
 views of the fetal anatomy.

## 2022-06-06 ENCOUNTER — Encounter (INDEPENDENT_AMBULATORY_CARE_PROVIDER_SITE_OTHER): Payer: BC Managed Care – PPO

## 2022-06-10 ENCOUNTER — Ambulatory Visit (INDEPENDENT_AMBULATORY_CARE_PROVIDER_SITE_OTHER): Payer: BC Managed Care – PPO

## 2022-06-10 VITALS — BP 104/64 | HR 86 | Wt 164.0 lb

## 2022-06-10 DIAGNOSIS — Z3A32 32 weeks gestation of pregnancy: Secondary | ICD-10-CM

## 2022-06-10 DIAGNOSIS — Z3480 Encounter for supervision of other normal pregnancy, unspecified trimester: Secondary | ICD-10-CM

## 2022-06-10 DIAGNOSIS — O09529 Supervision of elderly multigravida, unspecified trimester: Secondary | ICD-10-CM

## 2022-06-10 NOTE — Progress Notes (Signed)
   PRENATAL VISIT NOTE  Subjective:  Brandi Murray is a 37 y.o. [phone] at [redacted]w[redacted]d being seen today for ongoing prenatal care.  She is currently monitored for the following issues for this low-risk pregnancy and has Supervision of other normal pregnancy, antepartum and AMA (advanced maternal age) multigravida 35+, unspecified trimester on their problem list.  Patient reports no complaints. Reports some low back pain with standing for long periods of time, however resolves with rest. Contractions: Not present. Vag. Bleeding: None.  Movement: Present. Denies leaking of fluid.   The following portions of the patient's history were reviewed and updated as appropriate: allergies, current medications, past family history, past medical history, past social history, past surgical history and problem list.   Objective:   Vitals:   06/10/22 1548  BP: 104/64  Pulse: 86  Weight: 164 lb (74.4 kg)    Fetal Status: Fetal Heart Rate (bpm): 134 Fundal Height: 32 cm Movement: Present     General:  Alert, oriented and cooperative. Patient is in no acute distress.  Skin: Skin is warm and dry. No rash noted.   Cardiovascular: Normal heart rate noted  Respiratory: Normal respiratory effort, no problems with respiration noted  Abdomen: Soft, gravid, appropriate for gestational age.  Pain/Pressure: Absent     Pelvic: Cervical exam deferred        Extremities: Normal range of motion.  Edema: None  Mental Status: Normal mood and affect. Normal behavior. Normal judgment and thought content.   Assessment and Plan:  Pregnancy: G4P1021 at [redacted]w[redacted]d 1. Supervision of other normal pregnancy, antepartum - Routine OB. Doing well, no concerns - Anticipatory guidance for upcoming appointments provided  2. AMA (advanced maternal age) multigravida 29+, unspecified trimester   3. [redacted] weeks gestation of pregnancy - FH appropriate - Endorses active fetal movement   Preterm labor symptoms and general obstetric  precautions including but not limited to vaginal bleeding, contractions, leaking of fluid and fetal movement were reviewed in detail with the patient. Please refer to After Visit Summary for other counseling recommendations.   Return in about 2 weeks (around 06/24/2022).  Future Appointments  Date Time Provider [place]  06/26/2022  3:30 PM Radene Gunning, MD [facility]  07/10/2022  3:30 PM Radene Gunning, MD [facility]  07/17/2022  3:30 PM Radene Gunning, MD [facility]  07/24/2022  3:30 PM Inez Catalina, MD [place], CNM

## 2022-06-13 ENCOUNTER — Ambulatory Visit: Payer: BC Managed Care – PPO

## 2022-06-25 NOTE — Progress Notes (Unsigned)
   PRENATAL VISIT NOTE  Subjective:  Brandi LAURAIN is a 37 y.o. [phone] at 14w2dbeing seen today for ongoing prenatal care.  She is currently monitored for the following issues for this low-risk pregnancy and has Supervision of other normal pregnancy, antepartum and AMA (advanced maternal age) multigravida 35+, unspecified trimester on their problem list.  Patient reports {sx:14538}.   .  .   . Denies leaking of fluid.   The following portions of the patient's history were reviewed and updated as appropriate: allergies, current medications, past family history, past medical history, past social history, past surgical history and problem list.   Objective:  There were no vitals filed for this visit.  Fetal Status:           General:  Alert, oriented and cooperative. Patient is in no acute distress.  Skin: Skin is warm and dry. No rash noted.   Cardiovascular: Normal heart rate noted  Respiratory: Normal respiratory effort, no problems with respiration noted  Abdomen: Soft, gravid, appropriate for gestational age.        Pelvic: Cervical exam deferred        Extremities: Normal range of motion.     Mental Status: Normal mood and affect. Normal behavior. Normal judgment and thought content.   Assessment and Plan:  Pregnancy: G4P1021 at [redacted]w[redacted]d. Supervision of other normal pregnancy, antepartum GBS NV  2. AMA (advanced maternal age) multigravida 3577+unspecified trimester  Preterm labor symptoms and general obstetric precautions including but not limited to vaginal bleeding, contractions, leaking of fluid and fetal movement were reviewed in detail with the patient. Please refer to After Visit Summary for other counseling recommendations.   No follow-ups on file.  Future Appointments  Date Time Provider [place]2/29/2024  3:30 PM DuRadene GunningMD [facility]  3:30 PM [facility]Radene [facility]unningMD [facility]  3:30 PM DuRadene GunningMD  [facility]/28/20[facility]  3:30 PM FoInez CatalinaMD [facility]  PaRadene GunningMD

## 2022-06-26 ENCOUNTER — Ambulatory Visit (INDEPENDENT_AMBULATORY_CARE_PROVIDER_SITE_OTHER): Payer: BC Managed Care – PPO | Admitting: Obstetrics and Gynecology

## 2022-06-26 VITALS — BP 106/62 | HR 78 | Wt 168.0 lb

## 2022-06-26 DIAGNOSIS — Z3A34 34 weeks gestation of pregnancy: Secondary | ICD-10-CM

## 2022-06-26 DIAGNOSIS — O09523 Supervision of elderly multigravida, third trimester: Secondary | ICD-10-CM

## 2022-06-26 DIAGNOSIS — O09529 Supervision of elderly multigravida, unspecified trimester: Secondary | ICD-10-CM

## 2022-06-26 DIAGNOSIS — Z3480 Encounter for supervision of other normal pregnancy, unspecified trimester: Secondary | ICD-10-CM

## 2022-07-09 NOTE — Progress Notes (Unsigned)
   PRENATAL VISIT NOTE  Subjective:  Brandi Murray is a 37 y.o. [phone] at [redacted]w[redacted]d being seen today for ongoing prenatal care.  She is currently monitored for the following issues for this low-risk pregnancy and has Supervision of other normal pregnancy, antepartum and AMA (advanced maternal age) multigravida 35+, unspecified trimester on their problem list.  Patient reports {sx:14538}.   .  .   . Denies leaking of fluid.   The following portions of the patient's history were reviewed and updated as appropriate: allergies, current medications, past family history, past medical history, past social history, past surgical history and problem list.   Objective:  There were no vitals filed for this visit.  Fetal Status:           General:  Alert, oriented and cooperative. Patient is in no acute distress.  Skin: Skin is warm and dry. No rash noted.   Cardiovascular: Normal heart rate noted  Respiratory: Normal respiratory effort, no problems with respiration noted  Abdomen: Soft, gravid, appropriate for gestational age.        Pelvic: {Blank single:19197::"Cervical exam performed in the presence of a chaperone","Cervical exam deferred"}        Extremities: Normal range of motion.     Mental Status: Normal mood and affect. Normal behavior. Normal judgment and thought content.   Assessment and Plan:  Pregnancy: G4P1021 at [redacted]w[redacted]d 1. Supervision of other normal pregnancy, antepartum Gbs done  2. AMA (advanced maternal age) multigravida 58+, unspecified trimester   Preterm labor symptoms and general obstetric precautions including but not limited to vaginal bleeding, contractions, leaking of fluid and fetal movement were reviewed in detail with the patient. Please refer to After Visit Summary for other counseling recommendations.   No follow-ups on file.  Future Appointments  Date Time Provider [place]  07/10/2022  3:30 PM Radene Gunning, MD [facility]  07/17/2022  3:30  PM Radene Gunning, MD [facility]  07/24/2022  3:30 PM Inez Catalina, MD [facility]  07/31/2022  3:30 PM Janyth Pupa, DO [facility]    Radene Gunning, MD

## 2022-07-10 ENCOUNTER — Other Ambulatory Visit (HOSPITAL_COMMUNITY)
Admission: RE | Admit: 2022-07-10 | Discharge: 2022-07-10 | Disposition: A | Discharge status: Home / Self Care | Payer: BC Managed Care – PPO | Source: Ambulatory Visit | Attending: Obstetrics and Gynecology | Admitting: Obstetrics and Gynecology

## 2022-07-10 ENCOUNTER — Ambulatory Visit (INDEPENDENT_AMBULATORY_CARE_PROVIDER_SITE_OTHER): Payer: BC Managed Care – PPO | Admitting: Obstetrics and Gynecology

## 2022-07-10 VITALS — BP 108/68 | HR 85 | Wt 170.0 lb

## 2022-07-10 DIAGNOSIS — Z3480 Encounter for supervision of other normal pregnancy, unspecified trimester: Secondary | ICD-10-CM | POA: Insufficient documentation

## 2022-07-10 DIAGNOSIS — Z3A36 36 weeks gestation of pregnancy: Secondary | ICD-10-CM

## 2022-07-10 DIAGNOSIS — Z1339 Encounter for screening examination for other mental health and behavioral disorders: Secondary | ICD-10-CM

## 2022-07-10 DIAGNOSIS — O09529 Supervision of elderly multigravida, unspecified trimester: Secondary | ICD-10-CM

## 2022-07-10 LAB — OB RESULTS CONSOLE GBS: GBS: NEGATIVE

## 2022-07-11 LAB — CERVICOVAGINAL ANCILLARY ONLY
Chlamydia: NEGATIVE
Comment: NEGATIVE
Comment: NORMAL
Neisseria Gonorrhea: NEGATIVE

## 2022-07-13 LAB — CULTURE, BETA STREP (GROUP B ONLY)
MICRO NUMBER:: 14693242
SPECIMEN QUALITY:: ADEQUATE

## 2022-07-17 ENCOUNTER — Ambulatory Visit (INDEPENDENT_AMBULATORY_CARE_PROVIDER_SITE_OTHER): Payer: BC Managed Care – PPO | Admitting: Obstetrics and Gynecology

## 2022-07-17 ENCOUNTER — Encounter: Payer: Self-pay | Admitting: Obstetrics and Gynecology

## 2022-07-17 VITALS — BP 120/74 | HR 76 | Wt 171.0 lb

## 2022-07-17 DIAGNOSIS — Z3A37 37 weeks gestation of pregnancy: Secondary | ICD-10-CM

## 2022-07-17 DIAGNOSIS — O09523 Supervision of elderly multigravida, third trimester: Secondary | ICD-10-CM

## 2022-07-17 DIAGNOSIS — Z3480 Encounter for supervision of other normal pregnancy, unspecified trimester: Secondary | ICD-10-CM

## 2022-07-17 DIAGNOSIS — O09529 Supervision of elderly multigravida, unspecified trimester: Secondary | ICD-10-CM

## 2022-07-17 NOTE — Progress Notes (Signed)
   PRENATAL VISIT NOTE  Subjective:  Brandi Murray is a 37 y.o. [phone] at [redacted]w[redacted]d being seen today for ongoing prenatal care.  She is currently monitored for the following issues for this low-risk pregnancy and has Supervision of other normal pregnancy, antepartum and AMA (advanced maternal age) multigravida 35+, unspecified trimester on their problem list.  Patient reports no complaints.  Contractions: Not present. Vag. Bleeding: None.  Movement: Present. Denies leaking of fluid.   The following portions of the patient's history were reviewed and updated as appropriate: allergies, current medications, past family history, past medical history, past social history, past surgical history and problem list.   Objective:   Vitals:   07/17/22 1532  BP: 120/74  Pulse: 76  Weight: 171 lb (77.6 kg)    Fetal Status: Fetal Heart Rate (bpm): 144 Fundal Height: 37 cm Movement: Present     General:  Alert, oriented and cooperative. Patient is in no acute distress.  Skin: Skin is warm and dry. No rash noted.   Cardiovascular: Normal heart rate noted  Respiratory: Normal respiratory effort, no problems with respiration noted  Abdomen: Soft, gravid, appropriate for gestational age.  Pain/Pressure: Absent     Pelvic: Cervical exam deferred        Extremities: Normal range of motion.  Edema: None  Mental Status: Normal mood and affect. Normal behavior. Normal judgment and thought content.   Assessment and Plan:  Pregnancy: G4P1021 at [redacted]w[redacted]d 1. Supervision of other normal pregnancy, antepartum GBS neg  2. AMA (advanced maternal age) multigravida 56+, unspecified trimester  3. Pregnancy with 37 weeks completed gestation  Term labor symptoms and general obstetric precautions including but not limited to vaginal bleeding, contractions, leaking of fluid and fetal movement were reviewed in detail with the patient. Please refer to After Visit Summary for other counseling recommendations.   Return in  about 1 week (around 07/24/2022) for LROB VISIT, MD or APP.  Future Appointments  Date Time Provider [place]  07/24/2022  3:30 PM Inez Catalina, MD [facility]  07/31/2022  3:30 PM Janyth Pupa, DO [facility]    Radene Gunning, MD

## 2022-07-24 ENCOUNTER — Ambulatory Visit (INDEPENDENT_AMBULATORY_CARE_PROVIDER_SITE_OTHER): Payer: BC Managed Care – PPO | Admitting: Obstetrics and Gynecology

## 2022-07-24 VITALS — BP 123/76 | HR 79 | Wt 172.0 lb

## 2022-07-24 DIAGNOSIS — Z3A38 38 weeks gestation of pregnancy: Secondary | ICD-10-CM

## 2022-07-24 DIAGNOSIS — O09523 Supervision of elderly multigravida, third trimester: Secondary | ICD-10-CM

## 2022-07-24 DIAGNOSIS — Z3480 Encounter for supervision of other normal pregnancy, unspecified trimester: Secondary | ICD-10-CM

## 2022-07-24 DIAGNOSIS — O09529 Supervision of elderly multigravida, unspecified trimester: Secondary | ICD-10-CM

## 2022-07-24 NOTE — Progress Notes (Signed)
   PRENATAL VISIT NOTE  Subjective:  Brandi Murray is a 37 y.o. [phone] at [redacted]w[redacted]d being seen today for ongoing prenatal care.  She is currently monitored for the following issues for this low-risk pregnancy and has Supervision of other normal pregnancy, antepartum and AMA (advanced maternal age) multigravida 35+, unspecified trimester on their problem list.  Patient reports no complaints.  Contractions: Irritability. Vag. Bleeding: None.  Movement: Present. Denies leaking of fluid.   The following portions of the patient's history were reviewed and updated as appropriate: allergies, current medications, past family history, past medical history, past social history, past surgical history and problem list.   Objective:   Vitals:   07/24/22 1535  BP: 123/76  Pulse: 79  Weight: 172 lb (78 kg)    Fetal Status: Fetal Heart Rate (bpm): 135 Fundal Height: 38 cm Movement: Present     General:  Alert, oriented and cooperative. Patient is in no acute distress.  Skin: Skin is warm and dry. No rash noted.   Cardiovascular: Normal heart rate noted  Respiratory: Normal respiratory effort, no problems with respiration noted  Abdomen: Soft, gravid, appropriate for gestational age.  Pain/Pressure: Present      Assessment and Plan:  Pregnancy: G4P1021 at [redacted]w[redacted]d 1. Supervision of other normal pregnancy, antepartum 2. [redacted] weeks gestation of pregnancy Discussed membrane sweep at next appointment Earlier in pregnancy, pt had cards OB evaluation for SOB/palpitations. Her echo and holter monitor were normal. She has been asymptomatic so has not followed up with cardiology  3. AMA (advanced maternal age) multigravida 89+, unspecified trimester  Please refer to After Visit Summary for other counseling recommendations.   Return in about 1 week (around 07/31/2022) for return OB at 39 weeks.  Future Appointments  Date Time Provider [place]  07/31/2022  3:30 PM Janyth Pupa, DO [facility]  [facility]   Inez Catalina, MD

## 2022-07-28 ENCOUNTER — Inpatient Hospital Stay (HOSPITAL_COMMUNITY)
Admission: AD | Admit: 2022-07-28 | Discharge: 2022-07-29 | DRG: 807 | Disposition: A | Discharge status: Home / Self Care | Payer: BC Managed Care – PPO | Attending: Family Medicine | Admitting: Family Medicine

## 2022-07-28 ENCOUNTER — Encounter (HOSPITAL_COMMUNITY): Payer: Self-pay | Admitting: Family Medicine

## 2022-07-28 DIAGNOSIS — Z3A38 38 weeks gestation of pregnancy: Secondary | ICD-10-CM | POA: Diagnosis not present

## 2022-07-28 DIAGNOSIS — O09529 Supervision of elderly multigravida, unspecified trimester: Secondary | ICD-10-CM

## 2022-07-28 DIAGNOSIS — Z3480 Encounter for supervision of other normal pregnancy, unspecified trimester: Principal | ICD-10-CM

## 2022-07-28 DIAGNOSIS — O26893 Other specified pregnancy related conditions, third trimester: Secondary | ICD-10-CM | POA: Diagnosis not present

## 2022-07-28 LAB — RPR: RPR Ser Ql: NONREACTIVE

## 2022-07-28 LAB — CBC WITH DIFFERENTIAL/PLATELET
Abs Immature Granulocytes: 0.05 10*3/uL (ref 0.00–0.07)
Basophils Absolute: 0 10*3/uL (ref 0.0–0.1)
Basophils Relative: 0 %
Eosinophils Absolute: 0 10*3/uL (ref 0.0–0.5)
Eosinophils Relative: 0 %
HCT: 39 % (ref 36.0–46.0)
Hemoglobin: 13.5 g/dL (ref 12.0–15.0)
Immature Granulocytes: 0 %
Lymphocytes Relative: 12 %
Lymphs Abs: 1.4 10*3/uL (ref 0.7–4.0)
MCH: 32.9 pg (ref 26.0–34.0)
MCHC: 34.6 g/dL (ref 30.0–36.0)
MCV: 95.1 fL (ref 80.0–100.0)
Monocytes Absolute: 0.6 10*3/uL (ref 0.1–1.0)
Monocytes Relative: 6 %
Neutro Abs: 9.2 10*3/uL — ABNORMAL HIGH (ref 1.7–7.7)
Neutrophils Relative %: 82 %
Platelets: 208 10*3/uL (ref 150–400)
RBC: 4.1 MIL/uL (ref 3.87–5.11)
RDW: 13.2 % (ref 11.5–15.5)
WBC: 11.4 10*3/uL — ABNORMAL HIGH (ref 4.0–10.5)
nRBC: 0 % (ref 0.0–0.2)

## 2022-07-28 LAB — TYPE AND SCREEN
ABO/RH(D): O POS
Antibody Screen: NEGATIVE

## 2022-07-28 LAB — HIV ANTIBODY (ROUTINE TESTING W REFLEX): HIV Screen 4th Generation wRfx: NONREACTIVE

## 2022-07-28 MED ORDER — ACETAMINOPHEN 325 MG PO TABS: 650.0000 mg | ORAL_TABLET | ORAL | Status: DC | PRN

## 2022-07-28 MED ORDER — OXYTOCIN 10 UNIT/ML IJ SOLN
INTRAMUSCULAR | Status: AC
  Filled 2022-07-28: qty 1

## 2022-07-28 MED ORDER — ONDANSETRON HCL 4 MG/2ML IJ SOLN: 4.0000 mg | INTRAMUSCULAR | Status: DC | PRN

## 2022-07-28 MED ORDER — COCONUT OIL OIL: 1.0000 | TOPICAL_OIL | Status: DC | PRN

## 2022-07-28 MED ORDER — ONDANSETRON HCL 4 MG/2ML IJ SOLN: 4.0000 mg | Freq: Four times a day (QID) | INTRAMUSCULAR | Status: DC | PRN

## 2022-07-28 MED ORDER — OXYCODONE-ACETAMINOPHEN 5-325 MG PO TABS: 1.0000 | ORAL_TABLET | ORAL | Status: DC | PRN

## 2022-07-28 MED ORDER — SENNOSIDES-DOCUSATE SODIUM 8.6-50 MG PO TABS
2.0000 | ORAL_TABLET | Freq: Every day | ORAL | Status: DC
  Administered 2022-07-29: 2 via ORAL
  Filled 2022-07-28: qty 2

## 2022-07-28 MED ORDER — WITCH HAZEL-GLYCERIN EX PADS: 1.0000 | MEDICATED_PAD | CUTANEOUS | Status: DC | PRN

## 2022-07-28 MED ORDER — LIDOCAINE HCL (PF) 1 % IJ SOLN: 30.0000 mL | INTRAMUSCULAR | Status: DC | PRN

## 2022-07-28 MED ORDER — ONDANSETRON HCL 4 MG PO TABS: 4.0000 mg | ORAL_TABLET | ORAL | Status: DC | PRN

## 2022-07-28 MED ORDER — OXYTOCIN 10 UNIT/ML IJ SOLN
10.0000 [IU] | Freq: Once | INTRAMUSCULAR | Status: AC
  Administered 2022-07-28: 10 [IU] via INTRAMUSCULAR

## 2022-07-28 MED ORDER — SIMETHICONE 80 MG PO CHEW: 80.0000 mg | CHEWABLE_TABLET | ORAL | Status: DC | PRN

## 2022-07-28 MED ORDER — PRENATAL MULTIVITAMIN CH
1.0000 | ORAL_TABLET | Freq: Every day | ORAL | Status: DC
  Administered 2022-07-28 – 2022-07-29 (×2): 1 via ORAL
  Filled 2022-07-28 (×2): qty 1

## 2022-07-28 MED ORDER — DIBUCAINE (PERIANAL) 1 % EX OINT: 1.0000 | TOPICAL_OINTMENT | CUTANEOUS | Status: DC | PRN

## 2022-07-28 MED ORDER — FLEET ENEMA 7-19 GM/118ML RE ENEM: 1.0000 | ENEMA | RECTAL | Status: DC | PRN

## 2022-07-28 MED ORDER — LACTATED RINGERS IV SOLN: 500.0000 mL | INTRAVENOUS | Status: DC | PRN

## 2022-07-28 MED ORDER — DIPHENHYDRAMINE HCL 25 MG PO CAPS: 25.0000 mg | ORAL_CAPSULE | Freq: Four times a day (QID) | ORAL | Status: DC | PRN

## 2022-07-28 MED ORDER — ZOLPIDEM TARTRATE 5 MG PO TABS: 5.0000 mg | ORAL_TABLET | Freq: Every evening | ORAL | Status: DC | PRN

## 2022-07-28 MED ORDER — IBUPROFEN 600 MG PO TABS
600.0000 mg | ORAL_TABLET | Freq: Four times a day (QID) | ORAL | Status: DC
  Administered 2022-07-28: 600 mg via ORAL
  Filled 2022-07-28 (×3): qty 1

## 2022-07-28 MED ORDER — TETANUS-DIPHTH-ACELL PERTUSSIS 5-2.5-18.5 LF-MCG/0.5 IM SUSY: 0.5000 mL | PREFILLED_SYRINGE | Freq: Once | INTRAMUSCULAR | Status: DC

## 2022-07-28 MED ORDER — SOD CITRATE-CITRIC ACID 500-334 MG/5ML PO SOLN: 30.0000 mL | ORAL | Status: DC | PRN

## 2022-07-28 MED ORDER — OXYTOCIN BOLUS FROM INFUSION: 333.0000 mL | Freq: Once | INTRAVENOUS | Status: DC

## 2022-07-28 MED ORDER — LACTATED RINGERS IV SOLN: INTRAVENOUS | Status: DC

## 2022-07-28 MED ORDER — BENZOCAINE-MENTHOL 20-0.5 % EX AERO
1.0000 | INHALATION_SPRAY | CUTANEOUS | Status: DC | PRN
  Administered 2022-07-28: 1 via TOPICAL
  Filled 2022-07-28: qty 56

## 2022-07-28 MED ORDER — OXYCODONE-ACETAMINOPHEN 5-325 MG PO TABS: 2.0000 | ORAL_TABLET | ORAL | Status: DC | PRN

## 2022-07-28 MED ORDER — OXYTOCIN-SODIUM CHLORIDE 30-0.9 UT/500ML-% IV SOLN: 2.5000 [IU]/h | INTRAVENOUS | Status: DC

## 2022-07-28 NOTE — Discharge Summary (Signed)
Postpartum Discharge Summary  Date of Service updated***     Patient Name: Brandi Murray DOB: 1985-09-28 MRN: [id number]  Date of admission: 07/28/2022 Delivery date:07/28/2022  Delivering provider: Manya Silvas  Date of discharge: 07/28/2022  Admitting diagnosis: Term pregnancy [Z34.90] Intrauterine pregnancy: [redacted]w[redacted]d     Secondary diagnosis:  Active Problems:   Normal labor  Additional problems: ***    Discharge diagnosis: Term Pregnancy Delivered                                              Post partum procedures:{Postpartum procedures:23558} Augmentation: N/A Complications: None  Hospital course: Onset of Labor With Vaginal Delivery      37 y.o. yo [id number] at [redacted]w[redacted]d was admitted in Active Labor on 07/28/2022. Labor course was complicated by none  Membrane Rupture Time/Date: 3:30 AM ,07/28/2022   Delivery Method:Vaginal, Spontaneous  Episiotomy: None  Lacerations:  1st degree  Patient had a postpartum course complicated by ***.  She is ambulating, tolerating a regular diet, passing flatus, and urinating well. Patient is discharged home in stable condition on 07/28/22.  Newborn Data: Birth date:07/28/2022  Birth time:4:53 AM  Gender:Female  Living status:Living  Apgars:9 ,9  Weight:   Magnesium Sulfate received: No BMZ received: No Rhophylac:N/A MMR:N/A T-DaP: declined Flu: Yes - 02/06/2022 Transfusion:{Transfusion received:30440034}  Physical exam  Vitals:   07/28/22 0530 07/28/22 0545 07/28/22 0600 07/28/22 0615  BP: 137/69 122/84 126/72 126/83  Pulse: 91 71 78 77  Resp:      Temp:      TempSrc:       General: {Exam; general:21111117} Lochia: {Desc; appropriate/inappropriate:30686::"appropriate"} Uterine Fundus: {Desc; firm/soft:30687} Incision: {Exam; incision:21111123} DVT Evaluation: {Exam; dvt:2111122} Labs: Lab Results  Component Value Date   WBC 8.0 05/12/2022   HGB 12.8 05/12/2022   HCT 38.5 05/12/2022   MCV 97 05/12/2022   PLT 253 05/12/2022       Latest Ref Rng & Units 07/02/2021    9:17 AM  CMP  Glucose 70 - 99 mg/dL 80   BUN 6 - 23 mg/dL 10   Creatinine 0.40 - 1.20 mg/dL 0.66   Sodium 135 - 145 mEq/L 139   Potassium 3.5 - 5.1 mEq/L 4.0   Chloride 96 - 112 mEq/L 106   CO2 19 - 32 mEq/L 27   Calcium 8.4 - 10.5 mg/dL 9.7   Total Protein 6.0 - 8.3 g/dL 7.0   Total Bilirubin 0.2 - 1.2 mg/dL 0.6   Alkaline Phos 39 - 117 U/L 74   AST 0 - 37 U/L 19   ALT 0 - 35 U/L 15    Edinburgh Score:    04/09/2021    9:29 AM  Edinburgh Postnatal Depression Scale Screening Tool  I have been able to laugh and see the funny side of things. 0  I have looked forward with enjoyment to things. 0  I have blamed myself unnecessarily when things went wrong. 0  I have been anxious or worried for no good reason. 0  I have felt scared or panicky for no good reason. 0  Things have been getting on top of me. 0  I have been so unhappy that I have had difficulty sleeping. 0  I have felt sad or miserable. 0  I have been so unhappy that I have been crying. 0  The thought of harming  myself has occurred to me. 0  Edinburgh Postnatal Depression Scale Total 0     After visit meds:  Allergies as of 07/28/2022   No Known Allergies   Med Rec must be completed prior to using this [facility]***        Discharge home in stable condition Infant Feeding: Breast Infant Disposition:{CHL IP OB HOME WITH [id number] Discharge instruction: per After Visit Summary and Postpartum booklet. Activity: Advance as tolerated. Pelvic rest for 6 weeks.  Diet: routine diet Future Appointments: Future Appointments  Date Time Provider [place]  07/31/2022  3:30 PM Janyth Pupa, DO [facility]   Follow up Visit:  Message sent by R. Renato Battles, CNM on 07/28/2022 Please schedule this patient for a Virtual postpartum visit in 4 weeks with the following provider: Any provider. Additional Postpartum F/U: none   Low risk pregnancy complicated by:   none Delivery mode:  Vaginal, Spontaneous  Anticipated Birth Control:   Vasectomy (done 03/2022)   07/28/2022 Laury Deep, CNM

## 2022-07-28 NOTE — Lactation Note (Signed)
This note was copied from a baby's chart. Lactation Consultation Note  Patient Name: Brandi Murray [id number] Date: 07/28/2022 Age:36 hours - exp BF x 8 months  Reason for consult: Initial assessment;Early term 37-38.6wks;Breastfeeding assistance Per mom just fed 10 mnw. Baby rooting , LC offered to assist with the football position , mom receptive.  LC added support and baby opened wide and latched with depth. Multiple swallows noted and per mom comfortable. Baby fed 17 mins , and when released nipple well rounded , Latch score - 9 . Baby fells asleep.    Maternal Data Has patient been taught Hand Expression?: Yes Does the patient have breastfeeding experience prior to this delivery?: Yes How long did the patient breastfeed?: per  mom 8 months  Feeding Mother's Current Feeding Choice: Breast Milk  LATCH Score Latch: Grasps breast easily, tongue down, lips flanged, rhythmical sucking.  Audible Swallowing: Spontaneous and intermittent  Type of Nipple: Everted at rest and after stimulation  Comfort (Breast/Nipple): Soft / non-tender  Hold (Positioning): Assistance needed to correctly position infant at breast and maintain latch.  LATCH Score: 9   Lactation Tools Discussed/Used    Interventions Interventions: Breast feeding basics reviewed;Assisted with latch;Skin to skin;Breast massage;Hand express;Breast compression;Adjust position;Support pillows;Position options;Education;LC Services brochure  Discharge Pump: Personal;DEBP (per mom Spectra)  Consult Status Consult Status: Follow-up Date: 07/29/22 Follow-up type: In-patient    [place] 07/28/2022, 2:21 PM

## 2022-07-28 NOTE — Lactation Note (Signed)
This note was copied from a baby's chart. Lactation Consultation Note  Patient Name: Brandi Murray [id number] Date: 07/28/2022 Age:36 hours Reason for consult:  (attempted to see dyad , note on the door resting. baby recently fed and LC will F/U) 2 attempts and one BF for 15 mins .   Maternal Data    Feeding Mother's Current Feeding Choice: Breast Milk  LATCH Score     [name] [name] 07/28/2022, 12:19 PM

## 2022-07-28 NOTE — H&P (Signed)
OBSTETRIC ADMISSION HISTORY AND PHYSICAL  Brandi Murray is a 37 y.o. female [phone] with IUP at [redacted]w[redacted]d by early U/S presenting for active SOL and advanced cervical dilation.   Reports fetal movement. Denies vaginal bleeding.  She received her prenatal care at  [facility] .  Support person in labor: Jacelyn Pi U/S: Normal  Prenatal History/Complications: AMA (age 63 yo at delivery)  OB BOX: Nursing Staff Provider  Office Location  [place] Dating  LMP c/w early [place]  PNC Model [x]  Traditional [ ]  Centering [ ]  Mom-Baby Dyad    Language  English Anatomy US  Incomplete > f/u>Normal  Flu Vaccine  02/06/22 Genetic/Carrier Screen  NIPS:   LR NIPS, female AFP:   Negative  Horizon:  TDaP Vaccine   Declined Hgb A1C or  GTT Early  Third trimester: normal  COVID Vaccine    LAB RESULTS   Rhogam  N/A Blood Type O/RH(D) POSITIVE/-- (09/15 0959)   Baby Feeding Plan Breast Antibody NO ANTIBODIES DETECTED (09/15 0959)  Contraception Vasectomy Rubella 6.67 (09/15 0959) - IMMUNE  Circumcision Yes RPR NON-REACTIVE (09/15 0959)   Pediatrician  [facility]  HBsAg NON-REACTIVE (09/15 0959)   Support Person Simona Huh HCVAb neg  Prenatal Classes  HIV NON-REACTIVE (09/15 0959)     BTL Consent  GBS Negative/-- (11/12 0000) (For PCN allergy, check sensitivities)   VBAC Consent  Pap 03/2020, wnl       DME Rx [ ]  BP cuff [ ]  Weight Scale Waterbirth  [ ]  Class [ ]  Consent [ ]  CNM visit  PHQ9 & GAD7 [x]  new OB [x]  28 weeks  [x]  36 weeks Induction  [ ]  Orders Entered [ ] Foley Y/N   Past Medical History: Past Medical History:  Diagnosis Date   Medical history non-contributory    No known health problems     Past Surgical History: Past Surgical History:  Procedure Laterality Date   NO PAST SURGERIES     WISDOM TOOTH EXTRACTION      Obstetrical History: OB History     Gravida  4   Para  1   Term  1   Preterm  0   AB  2   Living  1      SAB  2   IAB  0   Ectopic  0    Multiple  0   Live Births  1           Social History: Social History   Socioeconomic History   Marital status: Married    Spouse name: Not on file   Number of children: 0   Years of education: Not on file   Highest education level: Not on file  Occupational History   Not on file  Tobacco Use   Smoking status: Never    Passive exposure: Never   Smokeless tobacco: Never  Vaping Use   Vaping Use: Never used  Substance and Sexual Activity   Alcohol use: Not Currently    Comment: social   Drug use: No   Sexual activity: Yes    Partners: Male    Birth control/protection: None  Other Topics Concern   Not on file  Social History Narrative   Not on file   Social Determinants of Health   Financial Resource Strain: Not on file  Food Insecurity: Not on file  Transportation Needs: Not on file  Physical Activity: Not on file  Stress: Not on file  Social Connections: Not on file  Family History: Family History  Problem Relation Age of Onset   Stroke Father    Epilepsy Father    Hypertension Paternal Grandmother    Diabetes Paternal Grandmother    Cancer Neg Hx    Heart disease Neg Hx    Asthma Neg Hx     Allergies: No Known Allergies  Medications Prior to Admission  Medication Sig Dispense Refill Last Dose   Prenatal Vit-Fe Fumarate-FA (MULTIVITAMIN-PRENATAL) 27-0.8 MG TABS tablet Take 1 tablet by mouth daily at 12 noon.        Review of Systems  All systems reviewed and negative except as stated in HPI  Last menstrual period 10/29/2021, currently breastfeeding. General appearance: alert, cooperative, and moderate distress Lungs: no respiratory distress Heart: regular rate  Abdomen: soft, non-tender; gravid Pelvic: adequate Extremities: Homans sign is negative, no sign of DVT Presentation: cephalic Fetal monitoring: FHR tracing x 2 mins with FHR at 110 bpm with 15 sec variable Uterine activity: frequent by palpation  Cervix: 7 cm in MAU   complete and pushing upon arrival to L&D  Prenatal labs: ABO, Rh: O/RH(D) POSITIVE/-- (09/15 0959) Antibody: NO ANTIBODIES DETECTED (09/15 0959) Rubella: 6.67 (09/15 0959) - IMMUNE RPR: Non Reactive (01/15 0828)  HBsAg: NON-REACTIVE (09/15 0959)  HIV: Non Reactive (01/15 0828)  GBS:  Negative Glucola: Normal Genetic screening:  Normal  Prenatal Transfer Tool  Maternal Diabetes: No Genetic Screening: Normal Maternal Ultrasounds/Referrals: Normal Fetal Ultrasounds or other Referrals:  None Maternal Substance Abuse:  No Significant Maternal Medications:  None Significant Maternal Lab Results: Group B Strep negative  No results found for this or any previous visit (from the past 24 hour(s)).  Patient Active Problem List   Diagnosis Date Noted   Normal labor 07/28/2022   AMA (advanced maternal age) multigravida 46+, unspecified trimester 02/06/2022   Supervision of other normal pregnancy, antepartum 12/31/2021    Assessment/Plan:  Brandi Murray is a 37 y.o. [phone] at [redacted]w[redacted]d here for active SOL and advanced cervical dilation.  Labor: active -- pain control: breathing techniques and labor support  Fetal Wellbeing: Cephalic by SVE.  -- GBS (Negative) -- continuous fetal monitoring - category 1   Postpartum Planning -- breast -- vasectomy for contraception    Laury Deep, CNM  07/28/2022, 4:56 AM

## 2022-07-28 NOTE — MAU Note (Signed)
Pt c/o feeling like she has to push . Water broke at 3;30 . V. Smith,CNM at bedside . SVE 7/90/0. FHR 125. Reports given to A. Lucilla Lame, Therapist, sports. Pt transported to room 216.

## 2022-07-29 LAB — CBC
HCT: 39.2 % (ref 36.0–46.0)
Hemoglobin: 13.2 g/dL (ref 12.0–15.0)
MCH: 32.8 pg (ref 26.0–34.0)
MCHC: 33.7 g/dL (ref 30.0–36.0)
MCV: 97.3 fL (ref 80.0–100.0)
Platelets: 206 10*3/uL (ref 150–400)
RBC: 4.03 MIL/uL (ref 3.87–5.11)
RDW: 13.4 % (ref 11.5–15.5)
WBC: 9.5 10*3/uL (ref 4.0–10.5)
nRBC: 0 % (ref 0.0–0.2)

## 2022-07-29 LAB — BIRTH TISSUE RECOVERY COLLECTION (PLACENTA DONATION)

## 2022-07-29 MED ORDER — IBUPROFEN 600 MG PO TABS: 600.0000 mg | ORAL_TABLET | Freq: Three times a day (TID) | ORAL | 0 refills | Status: DC | PRN

## 2022-07-29 MED ORDER — SENNOSIDES-DOCUSATE SODIUM 8.6-50 MG PO TABS: 2.0000 | ORAL_TABLET | Freq: Every day | ORAL | 0 refills | Status: DC | PRN

## 2022-07-29 NOTE — Discharge Instructions (Signed)
-   Continue your prenatal vitamins especially if breastfeeding - Try to eat iron rich food. - Take iron supplement as prescribed every other day. Take along with fruit or orange juice, avoid taking within 30 mins of eating dairy (milk, cheese) - Take over the counter tylenol (500mg) or ibuprofen (200mg) three times a day as needed for cramping/pain. - follow up in clinic in 4-6 weeks as scheduled for your regular post partum visit. - Please come back to MAU if you notice persistently elevated blood pressures or you start to have a headache, that doesn't get better with medications (tylenol and ibuprofen), rest (4hrs of sleep) and drinking water.  

## 2022-07-29 NOTE — Lactation Note (Signed)
This note was copied from a baby's chart. Lactation Consultation Note  Patient Name: Brandi Murray [id number] Date: 07/29/2022 Age:36 hours Reason for consult: Follow-up assessment;Early term 37-38.6wks;Infant weight loss;Breastfeeding assistance (5 % weight loss, baby latched when LC entere the , LC adjusted the pillows for depth) LC reviewed the BF D/C teaching and [place] resources.   Maternal Data Has patient been taught Hand Expression?: Yes  Feeding Mother's Current Feeding Choice: Breast Milk  LATCH Score Latch:  (baby semi shallow with latch and pillow support , depth improved)  Audible Swallowing:  (multiple swallows)  Type of Nipple:  (nipple well rounded when the baby released)  Comfort (Breast/Nipple):  (per mom breast filling and comfortable)  Hold (Positioning):  (mom had latched the baby , LC adjusted the pillows)      Lactation Tools Discussed/Used  Per mom has a hand pump at home   Interventions Interventions: Breast feeding basics reviewed;Breast compression;Adjust position;Support pillows;Education;LC Services brochure  Discharge Discharge Education: Engorgement and breast care;Warning signs for feeding baby Pump: Personal;DEBP;Manual WIC Program: No  Consult Status Consult Status: Complete Date: 07/29/22    Myer Haff 07/29/2022, 10:19 AM

## 2022-07-31 ENCOUNTER — Encounter: Payer: BC Managed Care – PPO | Admitting: Obstetrics & Gynecology

## 2022-08-04 ENCOUNTER — Encounter: Payer: Self-pay | Admitting: *Deleted

## 2022-08-07 ENCOUNTER — Telehealth (HOSPITAL_COMMUNITY): Payer: Self-pay | Admitting: *Deleted

## 2022-08-07 NOTE — Telephone Encounter (Signed)
Attempted Hospital Discharge Follow-Up Call.  Left voice mail requesting that patient return RN's phone call if patient has any concerns or questions.  

## 2022-09-02 ENCOUNTER — Ambulatory Visit (INDEPENDENT_AMBULATORY_CARE_PROVIDER_SITE_OTHER): Payer: BC Managed Care – PPO

## 2022-09-02 VITALS — BP 100/64 | HR 64 | Ht <= 58 in | Wt 150.0 lb

## 2022-09-02 DIAGNOSIS — R32 Unspecified urinary incontinence: Secondary | ICD-10-CM

## 2022-09-02 NOTE — Progress Notes (Signed)
Post Partum Visit Note  Brandi Murray is a 37 y.o. [phone] female who presents for a postpartum visit. She is 5 weeks postpartum following a normal spontaneous vaginal delivery.  I have fully reviewed the prenatal and intrapartum course. The delivery was at 38.6 gestational weeks.  Anesthesia: none. Postpartum course has been unremarkable. Baby is doing well. Baby is feeding by breast. Bleeding no bleeding. Bowel function is normal. Bladder function is normal. Patient is not sexually active. Contraception method is vasectomy. Postpartum depression screening: negative.   The pregnancy intention screening data noted above was reviewed. Potential methods of contraception were discussed. The patient elected to proceed with No data recorded.   Edinburgh Postnatal Depression Scale - 09/02/22 1550       Edinburgh Postnatal Depression Scale:  In the Past 7 Days   I have been able to laugh and see the funny side of things. 0    I have looked forward with enjoyment to things. 0    I have blamed myself unnecessarily when things went wrong. 0    I have been anxious or worried for no good reason. 0    I have felt scared or panicky for no good reason. 0    Things have been getting on top of me. 0    I have been so unhappy that I have had difficulty sleeping. 0    I have felt sad or miserable. 0    I have been so unhappy that I have been crying. 0    The thought of harming myself has occurred to me. 0    Edinburgh Postnatal Depression Scale Total 0             There are no preventive care reminders to display for this patient.  The following portions of the patient's history were reviewed and updated as appropriate: allergies, current medications, past family history, past medical history, past social history, past surgical history, and problem list.  Review of Systems Pertinent items are noted in HPI.  Objective:  BP 100/64   Pulse 64   Ht 2' (0.61 m)   Wt 150 lb (68 kg)   LMP  10/29/2021 (Exact Date)   Breastfeeding Yes   BMI 183.09 kg/m    General:  alert and no distress   Breasts:  not indicated  Lungs: Normal effort  Heart:  Regular rate  Abdomen: soft, non-tender; bowel sounds normal; no masses,  no organomegaly   Wound N/a  GU exam:  not indicated       Assessment:  1. Urinary incontinence, unspecified type  - Ambulatory referral to Physical Therapy  2. Postpartum care following vaginal delivery - Normal postpartum checkup   Plan:   Essential components of care per ACOG recommendations:  1.  Mood and well being: Patient with negative depression screening today. Reviewed local resources for support.  - Patient tobacco use? No.   - hx of drug use? No.    2. Infant care and feeding:  -Patient currently breastmilk feeding? Yes. Discussed returning to work and pumping. Reviewed importance of draining breast regularly to support lactation.  -Social determinants of health (SDOH) reviewed in EPIC.   3. Sexuality, contraception and birth spacing - Patient does not want a pregnancy in the next year.  Desired family size is 2 children.  - Reviewed reproductive life planning. Reviewed contraceptive methods based on pt preferences and effectiveness.  Patient desired Vasectomy today.   - Discussed birth spacing of 36  months  4. Sleep and fatigue -Encouraged family/partner/community support of 4 hrs of uninterrupted sleep to help with mood and fatigue  5. Physical Recovery  - Discussed patients delivery and complications. She describes her labor as good. - Patient had a Vaginal, no problems at delivery. Patient had a 1st degree laceration. Perineal healing reviewed. Patient expressed understanding - Patient has urinary incontinence? Yes. Discussed role of pelvic floor PT. Offered PT and patient accepted. Patient was referred to pelvic floor PT.  - Patient is safe to resume physical and sexual activity  6.  Health Maintenance - HM due items addressed  Yes - Last pap smear  Diagnosis  Date Value Ref Range Status  04/26/2020   Final   - Negative for Intraepithelial Lesions or Malignancy (NILM)  04/26/2020 - Benign reactive/reparative changes  Final   Pap smear not done at today's visit.  -Breast Cancer screening indicated? No.   7. Chronic Disease/Pregnancy Condition follow up: None  - PCP follow up  Brand Males, CNM [facility], [facility]

## 2022-09-08 ENCOUNTER — Ambulatory Visit: Payer: BC Managed Care – PPO | Admitting: Obstetrics & Gynecology

## 2022-11-10 ENCOUNTER — Ambulatory Visit: Payer: BC Managed Care – PPO | Admitting: Physical Therapy

## 2023-03-04 ENCOUNTER — Other Ambulatory Visit: Payer: Self-pay | Admitting: Family Medicine

## 2023-03-04 ENCOUNTER — Encounter: Payer: BC Managed Care – PPO | Admitting: Family Medicine

## 2023-03-04 ENCOUNTER — Ambulatory Visit: Payer: BC Managed Care – PPO | Admitting: Family Medicine

## 2023-03-04 ENCOUNTER — Encounter: Payer: Self-pay | Admitting: Family Medicine

## 2023-03-04 VITALS — BP 112/78 | HR 69 | Temp 98.0°F | Resp 16 | Ht 66.0 in | Wt 141.0 lb

## 2023-03-04 DIAGNOSIS — Z23 Encounter for immunization: Secondary | ICD-10-CM | POA: Diagnosis not present

## 2023-03-04 DIAGNOSIS — E559 Vitamin D deficiency, unspecified: Secondary | ICD-10-CM | POA: Diagnosis not present

## 2023-03-04 DIAGNOSIS — Z0000 Encounter for general adult medical examination without abnormal findings: Secondary | ICD-10-CM

## 2023-03-04 LAB — COMPREHENSIVE METABOLIC PANEL
ALT: 19 U/L (ref 0–35)
AST: 18 U/L (ref 0–37)
Albumin: 4.2 g/dL (ref 3.5–5.2)
Alkaline Phosphatase: 55 U/L (ref 39–117)
BUN: 9 mg/dL (ref 6–23)
CO2: 28 meq/L (ref 19–32)
Calcium: 9.2 mg/dL (ref 8.4–10.5)
Chloride: 105 meq/L (ref 96–112)
Creatinine, Ser: 0.64 mg/dL (ref 0.40–1.20)
GFR: 113.71 mL/min (ref >60.00)
Glucose, Bld: 83 mg/dL (ref 70–99)
Potassium: 3.9 meq/L (ref 3.5–5.1)
Sodium: 139 meq/L (ref 135–145)
Total Bilirubin: 0.6 mg/dL (ref 0.2–1.2)
Total Protein: 6.9 g/dL (ref 6.0–8.3)

## 2023-03-04 LAB — CBC
HCT: 41.1 % (ref 36.0–46.0)
Hemoglobin: 13.3 g/dL (ref 12.0–15.0)
MCHC: 32.3 g/dL (ref 30.0–36.0)
MCV: 95.7 fL (ref 78.0–100.0)
Platelets: 257 10*3/uL (ref 150.0–400.0)
RBC: 4.3 Mil/uL (ref 3.87–5.11)
RDW: 13.1 % (ref 11.5–15.5)
WBC: 5.3 10*3/uL (ref 4.0–10.5)

## 2023-03-04 LAB — LIPID PANEL
Cholesterol: 142 mg/dL (ref 0–200)
HDL: 45.8 mg/dL (ref >39.00)
LDL Cholesterol: 77 mg/dL (ref 0–99)
NonHDL: 96.18
Total CHOL/HDL Ratio: 3
Triglycerides: 94 mg/dL (ref 0.0–149.0)
VLDL: 18.8 mg/dL (ref 0.0–40.0)

## 2023-03-04 LAB — VITAMIN D 25 HYDROXY (VIT D DEFICIENCY, FRACTURES): VITD: 23.78 ng/mL — ABNORMAL LOW (ref 30.00–100.00)

## 2023-03-04 MED ORDER — VITAMIN D (ERGOCALCIFEROL) 1.25 MG (50000 UNIT) PO CAPS: 50000.0000 [IU] | ORAL_CAPSULE | ORAL | 0 refills | Status: AC

## 2023-03-04 NOTE — Progress Notes (Signed)
Chief Complaint  Patient presents with   Annual Exam    Annual Exam     Well Woman [name] is here for a complete physical.   Her last physical was >1 year ago.  Current diet: in general, a "healthy" diet. Current exercise: none. Fatigue out of ordinary? No Seatbelt? Yes Advanced directive? No  Health Maintenance Pap/HPV- Yes Tetanus- Yes HIV screening- Yes Hep C screening- Yes  Past Medical History:  Diagnosis Date   No known health problems      Past Surgical History:  Procedure Laterality Date   WISDOM TOOTH EXTRACTION      Medications  Takes no meds routinely.    Allergies No Known Allergies  Review of Systems: Constitutional:  no unexpected weight changes Eye:  no recent significant change in vision Ear/Nose/Mouth/Throat:  Ears:  no tinnitus or vertigo and no recent change in hearing Nose/Mouth/Throat:  no complaints of nasal congestion, no sore throat Cardiovascular: no chest pain Respiratory:  no cough and no shortness of breath Gastrointestinal:  no abdominal pain, +constipation GU:  Female: negative for dysuria or pelvic pain Musculoskeletal/Extremities: +low back pain with certain movements/positions Integumentary (Skin/Breast):  no abnormal skin lesions reported Neurologic:  no headaches Endocrine:  denies fatigue Hematologic/Lymphatic:  No areas of easy bleeding  Exam BP 112/78 (BP Location: Left Arm, Patient Position: Sitting, Cuff Size: Normal)   Pulse 69   Temp 98 F (36.7 C) (Oral)   Resp 16   Ht 5\' 6"  (1.676 m)   Wt 141 lb (64 kg)   SpO2 97%   BMI 22.76 kg/m  General:  well developed, well nourished, in no apparent distress Skin:  no significant moles, warts, or growths Head:  no masses, lesions, or tenderness Eyes:  pupils equal and round, sclera anicteric without injection Ears:  canals without lesions, TMs shiny without retraction, no obvious effusion, no erythema Nose:  nares patent, mucosa normal, and no drainage   Throat/Pharynx:  lips and gingiva without lesion; tongue and uvula midline; non-inflamed pharynx; no exudates or postnasal drainage Neck: neck supple without adenopathy, thyromegaly, or masses Lungs:  clear to auscultation, breath sounds equal bilaterally, no respiratory distress Cardio:  regular rate and rhythm, no bruits, no LE edema Abdomen:  abdomen soft, nontender; bowel sounds normal; no masses or organomegaly Genital: Defer to GYN Musculoskeletal:  symmetrical muscle groups noted without atrophy or deformity; poor hamstring flexibility; mild ttp over midline sacrum Extremities:  no clubbing, cyanosis, or edema, no deformities, no skin discoloration Neuro:  gait normal; deep tendon reflexes normal and symmetric Psych: well oriented with normal range of affect and appropriate judgment/insight  Assessment and Plan  Well adult exam - Plan: CBC, Comprehensive metabolic panel, Lipid panel  Need for influenza vaccination - Plan: Flu vaccine trivalent PF, 6mos and older(Flulaval,Afluria,Fluarix,Fluzone)  Vitamin D insufficiency - Plan: VITAMIN D 25 Hydroxy (Vit-D Deficiency, Fractures)   Well 37 y.o. female. Counseled on diet and exercise. Needs to do better w hydration.  Flu shot today.  Advanced directive form provided today.  Other orders as above. Stretches for back. PT if no better, suspect sacral involvement.  Follow up in 1 yr. The patient voiced understanding and agreement to the plan.  Brandi Murray [place], DO 03/04/23 10:10 AM

## 2023-03-04 NOTE — Patient Instructions (Addendum)
Give [place] 2-3 business days to get the results of your labs back.   Keep the diet clean and stay active.  Please get me a copy of your advanced directive form at your convenience.   Try to drink 55-60 oz of water daily outside of exercise.  Take Metamucil or Benefiber daily.  Let us know if you need anything.  EXERCISES  RANGE OF MOTION (ROM) AND STRETCHING EXERCISES - Low Back Pain Most people with lower back pain will find that their symptoms get worse with excessive bending forward (flexion) or arching at the lower back (extension). The exercises that will help resolve your symptoms will focus on the opposite motion.  If you have pain, numbness or tingling which travels down into your buttocks, leg or foot, the goal of the therapy is for these symptoms to move closer to your back and eventually resolve. Sometimes, these leg symptoms will get better, but your lower back pain may worsen. This is often an indication of progress in your rehabilitation. Be very alert to any changes in your symptoms and the activities in which you participated in the 24 hours prior to the change. Sharing this information with your caregiver will allow him or her to most efficiently treat your condition. These exercises may help you when beginning to rehabilitate your injury. Your symptoms may resolve with or without further involvement from your physician, physical therapist or athletic trainer. While completing these exercises, remember:  Restoring tissue flexibility helps normal motion to return to the joints. This allows healthier, less painful movement and activity. An effective stretch should be held for at least 30 seconds. A stretch should never be painful. You should only feel a gentle lengthening or release in the stretched tissue. FLEXION RANGE OF MOTION AND STRETCHING EXERCISES:  STRETCH - Flexion, Single Knee to Chest  Lie on a firm bed or floor with both legs extended in front of you. Keeping one leg  in contact with the floor, bring your opposite knee to your chest. Hold your leg in place by either grabbing behind your thigh or at your knee. Pull until you feel a gentle stretch in your low back. Hold 30 seconds. Slowly release your grasp and repeat the exercise with the opposite side. Repeat 2 times. Complete this exercise 3 times per week.   STRETCH - Flexion, Double Knee to Chest Lie on a firm bed or floor with both legs extended in front of you. Keeping one leg in contact with the floor, bring your opposite knee to your chest. Tense your stomach muscles to support your back and then lift your other knee to your chest. Hold your legs in place by either grabbing behind your thighs or at your knees. Pull both knees toward your chest until you feel a gentle stretch in your low back. Hold 30 seconds. Tense your stomach muscles and slowly return one leg at a time to the floor. Repeat 2 times. Complete this exercise 3 times per week.   STRETCH - Low Trunk Rotation Lie on a firm bed or floor. Keeping your legs in front of you, bend your knees so they are both pointed toward the ceiling and your feet are flat on the floor. Extend your arms out to the side. This will stabilize your upper body by keeping your shoulders in contact with the floor. Gently and slowly drop both knees together to one side until you feel a gentle stretch in your low back. Hold for 30 seconds. Tense your  stomach muscles to support your lower back as you bring your knees back to the starting position. Repeat the exercise to the other side. Repeat 2 times. Complete this exercise at least 3 times per week.   EXTENSION RANGE OF MOTION AND FLEXIBILITY EXERCISES:  STRETCH - Extension, Prone on Elbows  Lie on your stomach on the floor, a bed will be too soft. Place your palms about shoulder width apart and at the height of your head. Place your elbows under your shoulders. If this is too painful, stack pillows under your  chest. Allow your body to relax so that your hips drop lower and make contact more completely with the floor. Hold this position for 30 seconds. Slowly return to lying flat on the floor. Repeat 2 times. Complete this exercise 3 times per week.   RANGE OF MOTION - Extension, Prone Press Ups Lie on your stomach on the floor, a bed will be too soft. Place your palms about shoulder width apart and at the height of your head. Keeping your back as relaxed as possible, slowly straighten your elbows while keeping your hips on the floor. You may adjust the placement of your hands to maximize your comfort. As you gain motion, your hands will come more underneath your shoulders. Hold this position 30 seconds. Slowly return to lying flat on the floor. Repeat 2 times. Complete this exercise 3 times per week.   RANGE OF MOTION- Quadruped, Neutral Spine  Assume a hands and knees position on a firm surface. Keep your hands under your shoulders and your knees under your hips. You may place padding under your knees for comfort. Drop your head and point your tailbone toward the ground below you. This will round out your lower back like an angry cat. Hold this position for 30 seconds. Slowly lift your head and release your tail bone so that your back sags into a large arch, like an old horse. Hold this position for 30 seconds. Repeat this until you feel limber in your low back. Now, find your "sweet spot." This will be the most comfortable position somewhere between the two previous positions. This is your neutral spine. Once you have found this position, tense your stomach muscles to support your low back. Hold this position for 30 seconds. Repeat 2 times. Complete this exercise 3 times per week.   STRENGTHENING EXERCISES - Low Back Sprain These exercises may help you when beginning to rehabilitate your injury. These exercises should be done near your "sweet spot." This is the neutral, low-back arch, somewhere  between fully rounded and fully arched, that is your least painful position. When performed in this safe range of motion, these exercises can be used for people who have either a flexion or extension based injury. These exercises may resolve your symptoms with or without further involvement from your physician, physical therapist or athletic trainer. While completing these exercises, remember:  Muscles can gain both the endurance and the strength needed for everyday activities through controlled exercises. Complete these exercises as instructed by your physician, physical therapist or athletic trainer. Increase the resistance and repetitions only as guided. You may experience muscle soreness or fatigue, but the pain or discomfort you are trying to eliminate should never worsen during these exercises. If this pain does worsen, stop and make certain you are following the directions exactly. If the pain is still present after adjustments, discontinue the exercise until you can discuss the trouble with your caregiver.  STRENGTHENING - Deep Abdominals,  Pelvic Tilt  Lie on a firm bed or floor. Keeping your legs in front of you, bend your knees so they are both pointed toward the ceiling and your feet are flat on the floor. Tense your lower abdominal muscles to press your low back into the floor. This motion will rotate your pelvis so that your tail bone is scooping upwards rather than pointing at your feet or into the floor. With a gentle tension and even breathing, hold this position for 3 seconds. Repeat 2 times. Complete this exercise 3 times per week.   STRENGTHENING - Abdominals, Crunches  Lie on a firm bed or floor. Keeping your legs in front of you, bend your knees so they are both pointed toward the ceiling and your feet are flat on the floor. Cross your arms over your chest. Slightly tip your chin down without bending your neck. Tense your abdominals and slowly lift your trunk high enough to just  clear your shoulder blades. Lifting higher can put excessive stress on the lower back and does not further strengthen your abdominal muscles. Control your return to the starting position. Repeat 2 times. Complete this exercise 3 times per week.   STRENGTHENING - Quadruped, Opposite UE/LE Lift  Assume a hands and knees position on a firm surface. Keep your hands under your shoulders and your knees under your hips. You may place padding under your knees for comfort. Find your neutral spine and gently tense your abdominal muscles so that you can maintain this position. Your shoulders and hips should form a rectangle that is parallel with the floor and is not twisted. Keeping your trunk steady, lift your right hand no higher than your shoulder and then your left leg no higher than your hip. Make sure you are not holding your breath. Hold this position for 30 seconds. Continuing to keep your abdominal muscles tense and your back steady, slowly return to your starting position. Repeat with the opposite arm and leg. Repeat 2 times. Complete this exercise 3 times per week.   STRENGTHENING - Abdominals and Quadriceps, Straight Leg Raise  Lie on a firm bed or floor with both legs extended in front of you. Keeping one leg in contact with the floor, bend the other knee so that your foot can rest flat on the floor. Find your neutral spine, and tense your abdominal muscles to maintain your spinal position throughout the exercise. Slowly lift your straight leg off the floor about 6 inches for a count of 3, making sure to not hold your breath. Still keeping your neutral spine, slowly lower your leg all the way to the floor. Repeat this exercise with each leg 2 times. Complete this exercise 3 times per week.  POSTURE AND BODY MECHANICS CONSIDERATIONS - Low Back Sprain Keeping correct posture when sitting, standing or completing your activities will reduce the stress put on different body tissues, allowing injured  tissues a chance to heal and limiting painful experiences. The following are general guidelines for improved posture.  While reading these guidelines, remember: The exercises prescribed by your provider will help you have the flexibility and strength to maintain correct postures. The correct posture provides the best environment for your joints to work. All of your joints have less wear and tear when properly supported by a spine with good posture. This means you will experience a healthier, less painful body. Correct posture must be practiced with all of your activities, especially prolonged sitting and standing. Correct posture is as important  when doing repetitive low-stress activities (typing) as it is when doing a single heavy-load activity (lifting).  RESTING POSITIONS Consider which positions are most painful for you when choosing a resting position. If you have pain with flexion-based activities (sitting, bending, stooping, squatting), choose a position that allows you to rest in a less flexed posture. You would want to avoid curling into a fetal position on your side. If your pain worsens with extension-based activities (prolonged standing, working overhead), avoid resting in an extended position such as sleeping on your stomach. Most people will find more comfort when they rest with their spine in a more neutral position, neither too rounded nor too arched. Lying on a non-sagging bed on your side with a pillow between your knees, or on your back with a pillow under your knees will often provide some relief. Keep in mind, being in any one position for a prolonged period of time, no matter how correct your posture, can still lead to stiffness.  PROPER SITTING POSTURE In order to minimize stress and discomfort on your spine, you must sit with correct posture. Sitting with good posture should be effortless for a healthy body. Returning to good posture is a gradual process. Many people can work toward  this most comfortably by using various supports until they have the flexibility and strength to maintain this posture on their own. When sitting with proper posture, your ears will fall over your shoulders and your shoulders will fall over your hips. You should use the back of the chair to support your upper back. Your lower back will be in a neutral position, just slightly arched. You may place a small pillow or folded towel at the base of your lower back for  support.  When working at a desk, create an environment that supports good, upright posture. Without extra support, muscles tire, which leads to excessive strain on joints and other tissues. Keep these recommendations in mind:  CHAIR: A chair should be able to slide under your desk when your back makes contact with the back of the chair. This allows you to work closely. The chair's height should allow your eyes to be level with the upper part of your monitor and your hands to be slightly lower than your elbows.  BODY POSITION Your feet should make contact with the floor. If this is not possible, use a foot rest. Keep your ears over your shoulders. This will reduce stress on your neck and low back.  INCORRECT SITTING POSTURES  If you are feeling tired and unable to assume a healthy sitting posture, do not slouch or slump. This puts excessive strain on your back tissues, causing more damage and pain. Healthier options include: Using more support, like a lumbar pillow. Switching tasks to something that requires you to be upright or walking. Talking a brief walk. Lying down to rest in a neutral-spine position.  PROLONGED STANDING WHILE SLIGHTLY LEANING FORWARD  When completing a task that requires you to lean forward while standing in one place for a long time, place either foot up on a stationary 2-4 inch high object to help maintain the best posture. When both feet are on the ground, the lower back tends to lose its slight inward curve. If  this curve flattens (or becomes too large), then the back and your other joints will experience too much stress, tire more quickly, and can cause pain.  CORRECT STANDING POSTURES Proper standing posture should be assumed with all daily activities, even if  they only take a few moments, like when brushing your teeth. As in sitting, your ears should fall over your shoulders and your shoulders should fall over your hips. You should keep a slight tension in your abdominal muscles to brace your spine. Your tailbone should point down to the ground, not behind your body, resulting in an over-extended swayback posture.   INCORRECT STANDING POSTURES  Common incorrect standing postures include a forward head, locked knees and/or an excessive swayback. WALKING Walk with an upright posture. Your ears, shoulders and hips should all line-up.  PROLONGED ACTIVITY IN A FLEXED POSITION When completing a task that requires you to bend forward at your waist or lean over a low surface, try to find a way to stabilize 3 out of 4 of your limbs. You can place a hand or elbow on your thigh or rest a knee on the surface you are reaching across. This will provide you more stability, so that your muscles do not tire as quickly. By keeping your knees relaxed, or slightly bent, you will also reduce stress across your lower back. CORRECT LIFTING TECHNIQUES  DO : Assume a wide stance. This will provide you more stability and the opportunity to get as close as possible to the object which you are lifting. Tense your abdominals to brace your spine. Bend at the knees and hips. Keeping your back locked in a neutral-spine position, lift using your leg muscles. Lift with your legs, keeping your back straight. Test the weight of unknown objects before attempting to lift them. Try to keep your elbows locked down at your sides in order get the best strength from your shoulders when carrying an object.   Always ask for help when lifting  heavy or awkward objects. INCORRECT LIFTING TECHNIQUES DO NOT:  Lock your knees when lifting, even if it is a small object. Bend and twist. Pivot at your feet or move your feet when needing to change directions. Assume that you can safely pick up even a paperclip without proper posture.

## 2023-03-06 ENCOUNTER — Other Ambulatory Visit: Payer: Self-pay

## 2023-03-06 DIAGNOSIS — E559 Vitamin D deficiency, unspecified: Secondary | ICD-10-CM

## 2023-04-16 ENCOUNTER — Other Ambulatory Visit: Payer: BC Managed Care – PPO

## 2023-05-18 ENCOUNTER — Encounter: Payer: Self-pay | Admitting: Family Medicine

## 2023-05-18 ENCOUNTER — Other Ambulatory Visit (INDEPENDENT_AMBULATORY_CARE_PROVIDER_SITE_OTHER): Payer: BC Managed Care – PPO

## 2023-05-18 DIAGNOSIS — E559 Vitamin D deficiency, unspecified: Secondary | ICD-10-CM

## 2023-05-18 LAB — VITAMIN D 25 HYDROXY (VIT D DEFICIENCY, FRACTURES): VITD: 41.04 ng/mL (ref 30.00–100.00)

## 2023-09-17 ENCOUNTER — Ambulatory Visit: Admitting: Obstetrics and Gynecology

## 2023-09-17 VITALS — BP 111/73 | HR 76 | Wt 142.0 lb

## 2023-09-17 DIAGNOSIS — Z1331 Encounter for screening for depression: Secondary | ICD-10-CM | POA: Diagnosis not present

## 2023-09-17 DIAGNOSIS — Z01419 Encounter for gynecological examination (general) (routine) without abnormal findings: Secondary | ICD-10-CM

## 2023-09-17 NOTE — Progress Notes (Signed)
 ANNUAL EXAM Patient name: Brandi Murray MRN [id number]  Date of birth: [birth year] Chief Complaint:   Gynecologic Exam  History of Present Illness:   Brandi Murray is a 38 y.o. [id number] with Patient's last menstrual period was 08/15/2023 (exact date). being seen today for a routine annual exam.  Current complaints: spot on right breast, present x 2 years, not changing in size or appearance, non painful   Upstream - 09/17/23 1633       Pregnancy Intention Screening   Does the patient want to become pregnant in the next year? No    Does the patient's partner want to become pregnant in the next year? No    Would the patient like to discuss contraceptive options today? N/A      Contraception Wrap Up   Current Method Vasectomy    End Method Vasectomy    Contraception Counseling Provided No    How was the end contraceptive method provided? N/A            The pregnancy intention screening data noted above was reviewed. Potential methods of contraception were discussed. The patient elected to proceed with Vasectomy.   Last pap 04/26/20. Results were: NILM w/ HRHPV negative. H/O abnormal pap: no Last mammogram: n/a. Results were: N/A. Family h/o breast cancer: yes aunt Last colonoscopy: n/a. Results were: N/A. Family h/o colorectal cancer: no HPV vaccine: unsure - considering     09/17/2023    3:14 PM 03/04/2023    9:45 AM 07/10/2022    3:40 PM 01/10/2022    9:13 AM 07/02/2021    9:00 AM  Depression screen PHQ 2/9  Decreased Interest 0 0 0 0 0  Down, Depressed, Hopeless 0 0 0 0 0  PHQ - 2 Score 0 0 0 0 0  Altered sleeping 0  0 0   Tired, decreased energy 0  1 1   Change in appetite 0  1 1   Feeling bad or failure about yourself  0  0 0   Trouble concentrating 0  0 0   Moving slowly or fidgety/restless 0  0 0   Suicidal thoughts 0  0 0   PHQ-9 Score 0  2 2         09/17/2023    3:14 PM 07/10/2022    3:41 PM 01/10/2022    9:13 AM 08/23/2020    9:15 AM  GAD 7 : Generalized  Anxiety Score  Nervous, Anxious, on Edge 1 0 0 0  Control/stop worrying 1 0 0 0  Worry too much - different things 1 0 0 0  Trouble relaxing 1 0 0 0  Restless 1 0 0 0  Easily annoyed or irritable 1 0 0 0  Afraid - awful might happen 1 0 0 0  Total GAD 7 Score 7 0 0 0     Review of Systems:   Pertinent items are noted in HPI Denies any headaches, blurred vision, fatigue, shortness of breath, chest pain, abdominal pain, abnormal vaginal discharge/itching/odor/irritation, problems with periods, bowel movements, urination, or intercourse unless otherwise stated above. Pertinent History Reviewed:  Reviewed past medical,surgical, social and family history.  Reviewed problem list, medications and allergies. Physical Assessment:   Vitals:   09/17/23 1542  BP: 111/73  Pulse: 76  Weight: 142 lb (64.4 kg)  Body mass index is 22.92 kg/m.        Physical Examination:   General appearance - well appearing, and in no distress  Mental  status - alert, oriented to person, place, and time  Chest - respiratory effort normal  Heart - normal peripheral perfusion  Breasts - breasts appear normal, no suspicious masses, no skin or nipple changes or axillary nodes.  Small 2-29mm nevus vs hemangioma noted, non tender, no asymmetry/discoloration or features of malignancy. Photos taken and uploaded to Media tab  Abdomen - soft, nontender, nondistended, no masses or organomegaly  Pelvic - VULVA: normal appearing vulva with no masses, tenderness or lesions   CERVIX: normal texture, non tender  UTERUS: uterus is felt to be normal size, shape, consistency and nontender   ADNEXA: No adnexal masses or tenderness noted.  Chaperone present for exam  No results found for this or any previous visit (from the past 24 hours).  Assessment & Plan:  1) Well-Woman Exam Mammogram: @ 38yo, or sooner if problems Colonoscopy: @ 38yo, or sooner if problems Pap: Due 2026 Gardasil: Considering GC/CT: UTD HIV/HCV:  UTD Vasectomy for contraception  Follow-up: Return in about 1 year (around 09/16/2024) for annual exam with pap.  Izell Marsh, MD 09/17/2023 4:37 PM

## 2024-05-21 ENCOUNTER — Encounter: Payer: Self-pay | Admitting: Emergency Medicine

## 2024-05-21 ENCOUNTER — Ambulatory Visit
Admission: EM | Admit: 2024-05-21 | Discharge: 2024-05-21 | Disposition: A | Discharge status: Home / Self Care | Attending: Internal Medicine | Admitting: Internal Medicine

## 2024-05-21 ENCOUNTER — Other Ambulatory Visit: Payer: Self-pay

## 2024-05-21 DIAGNOSIS — H6121 Impacted cerumen, right ear: Secondary | ICD-10-CM

## 2024-05-21 DIAGNOSIS — H9201 Otalgia, right ear: Secondary | ICD-10-CM | POA: Diagnosis not present

## 2024-05-21 DIAGNOSIS — H66001 Acute suppurative otitis media without spontaneous rupture of ear drum, right ear: Secondary | ICD-10-CM

## 2024-05-21 MED ORDER — AMOXICILLIN-POT CLAVULANATE 875-125 MG PO TABS: 1.0000 | ORAL_TABLET | Freq: Two times a day (BID) | ORAL | 0 refills | Status: AC

## 2024-05-21 NOTE — ED Triage Notes (Signed)
 Pt c/o right ear pain that started last night. States pain is constant and she has not tried anything for this.

## 2024-05-21 NOTE — Discharge Instructions (Addendum)
 Right ear irrigation done today due to excessive cerumen in the ear (earwax).  After irrigation there is fluid behind the tympanic membrane (eardrum) and the tympanic membrane is erythematous.  This is consistent with otitis media (ear infection).  We will treat this with antibiotics by mouth.  We recommend the following: Augmentin  875 mg twice daily for 10 days.  This is an antibiotic.  Take this with food.  May alternate Tylenol  and ibuprofen  for pain Make sure to stay hydrated by drinking plenty of water. Return to urgent care or PCP if symptoms worsen or fail to resolve.

## 2024-05-21 NOTE — ED Provider Notes (Signed)
 " [facility]    CSN: [id number] Arrival date & time: 05/21/24  1151      History   Chief Complaint Chief Complaint  Patient presents with   Otalgia    HPI Brandi Murray is a 39 y.o. female.   39 year old female who presents urgent care with complaints of right ear pain.  She reports that she began having severe pain in the ear last night.  She reports that she has had some tenderness to the touch of the right ear for several months but last night she began to have pain just at rest.  She denies any decreased hearing.  She did have the flu about a month ago but reports she has not had any major symptoms from that recently.  She denies any congestion, fever, cough, sore throat, nausea, vomiting, headache.   Otalgia Associated symptoms: no abdominal pain, no cough, no fever, no rash, no sore throat and no vomiting     Past Medical History:  Diagnosis Date   No known health problems     Patient Active Problem List   Diagnosis Date Noted   SVD (spontaneous vaginal delivery) 07/28/2022   AMA (advanced maternal age) multigravida 35+, unspecified trimester 02/06/2022   Supervision of other normal pregnancy, antepartum 12/31/2021    Past Surgical History:  Procedure Laterality Date   WISDOM TOOTH EXTRACTION      OB History     Gravida  4   Para  2   Term  2   Preterm  0   AB  2   Living  2      SAB  2   IAB  0   Ectopic  0   Multiple  0   Live Births  2            Home Medications    Prior to Admission medications  Medication Sig Start Date End Date Taking? Authorizing Provider  amoxicillin -clavulanate (AUGMENTIN ) 875-125 MG tablet Take 1 tablet by mouth every 12 (twelve) hours for 10 days. 05/21/24 05/31/24 Yes Jalissa Heinzelman A, PA-C  Vitamin D , Ergocalciferol , (DRISDOL ) 1.25 MG (50000 UNIT) CAPS capsule Take 1 capsule (50,000 Units total) by mouth every 7 (seven) days. 03/04/23   Frann Mabel Mt, DO    Family  History Family History  Problem Relation Age of Onset   Stroke Father    Epilepsy Father    Hypertension Paternal Grandmother    Diabetes Paternal Grandmother    Cancer Neg Hx    Heart disease Neg Hx    Asthma Neg Hx     Social History Social History[1]   Allergies   Patient has no known allergies.   Review of Systems Review of Systems  Constitutional:  Negative for chills and fever.  HENT:  Positive for ear pain. Negative for sore throat.   Eyes:  Negative for pain and visual disturbance.  Respiratory:  Negative for cough and shortness of breath.   Cardiovascular:  Negative for chest pain and palpitations.  Gastrointestinal:  Negative for abdominal pain and vomiting.  Genitourinary:  Negative for dysuria and hematuria.  Musculoskeletal:  Negative for arthralgias and back pain.  Skin:  Negative for color change and rash.  Neurological:  Negative for seizures and syncope.  All other systems reviewed and are negative.    Physical Exam Triage Vital Signs ED Triage Vitals  Encounter Vitals Group     BP 05/21/24 1320 120/78     Girls Systolic BP Percentile --  Girls Diastolic BP Percentile --      Boys Systolic BP Percentile --      Boys Diastolic BP Percentile --      Pulse Rate 05/21/24 1320 78     Resp --      Temp 05/21/24 1320 99 F (37.2 C)     Temp Source 05/21/24 1320 Oral     SpO2 05/21/24 1320 98 %     Weight --      Height --      Head Circumference --      Peak Flow --      Pain Score 05/21/24 1321 6     Pain Loc --      Pain Education --      Exclude from Growth Chart --    No data found.  Updated Vital Signs BP 120/78 (BP Location: Right Arm)   Pulse 78   Temp 99 F (37.2 C) (Oral)   SpO2 98%   Visual Acuity Right Eye Distance:   Left Eye Distance:   Bilateral Distance:    Right Eye Near:   Left Eye Near:    Bilateral Near:     Physical Exam Vitals and nursing note reviewed.  Constitutional:      General: She is not in  acute distress.    Appearance: She is well-developed.  HENT:     Head: Normocephalic and atraumatic.     Right Ear: There is impacted cerumen.  Eyes:     Conjunctiva/sclera: Conjunctivae normal.  Cardiovascular:     Rate and Rhythm: Normal rate and regular rhythm.     Heart sounds: No murmur heard. Pulmonary:     Effort: Pulmonary effort is normal. No respiratory distress.     Breath sounds: Normal breath sounds.  Abdominal:     Palpations: Abdomen is soft.     Tenderness: There is no abdominal tenderness.  Musculoskeletal:        General: No swelling.     Cervical back: Neck supple.  Skin:    General: Skin is warm and dry.     Capillary Refill: Capillary refill takes less than 2 seconds.  Neurological:     Mental Status: She is alert.  Psychiatric:        Mood and Affect: Mood normal.      UC Treatments / Results  Labs (all labs ordered are listed, but only abnormal results are displayed) Labs Reviewed - No data to display  EKG   Radiology No results found.  Procedures Procedures (including critical care time)  Medications Ordered in UC Medications - No data to display  Initial Impression / Assessment and Plan / UC Course  I have reviewed the triage vital signs and the nursing notes.  Pertinent labs & imaging results that were available during my care of the patient were reviewed by me and considered in my medical decision making (see chart for details).     Non-recurrent acute suppurative otitis media of right ear without spontaneous rupture of tympanic membrane  Otalgia of right ear  Impacted cerumen of right ear   Right ear irrigation done today due to excessive cerumen in the ear (earwax).  After irrigation there is fluid behind the tympanic membrane (eardrum) and the tympanic membrane is erythematous.  This is consistent with otitis media (ear infection).  We will treat this with antibiotics by mouth.  We recommend the following: Augmentin  875 mg  twice daily for 10 days.  This is an antibiotic.  Take this with food.  May alternate Tylenol  and ibuprofen  for pain Make sure to stay hydrated by drinking plenty of water. Return to urgent care or PCP if symptoms worsen or fail to resolve.    Final Clinical Impressions(s) / UC Diagnoses   Final diagnoses:  Otalgia of right ear  Impacted cerumen of right ear  Non-recurrent acute suppurative otitis media of right ear without spontaneous rupture of tympanic membrane     Discharge Instructions      Right ear irrigation done today due to excessive cerumen in the ear (earwax).  After irrigation there is fluid behind the tympanic membrane (eardrum) and the tympanic membrane is erythematous.  This is consistent with otitis media (ear infection).  We will treat this with antibiotics by mouth.  We recommend the following: Augmentin  875 mg twice daily for 10 days.  This is an antibiotic.  Take this with food.  May alternate Tylenol  and ibuprofen  for pain Make sure to stay hydrated by drinking plenty of water. Return to urgent care or PCP if symptoms worsen or fail to resolve.       ED Prescriptions     Medication Sig Dispense Auth. Provider   amoxicillin -clavulanate (AUGMENTIN ) 875-125 MG tablet Take 1 tablet by mouth every 12 (twelve) hours for 10 days. 20 tablet Teresa Almarie LABOR, [place]      PDMP not reviewed this encounter.     [1]  Social History Tobacco Use   Smoking status: Never    Passive exposure: Never   Smokeless tobacco: Never  Vaping Use   Vaping status: Never Used  Substance Use Topics   Alcohol use: Not Currently    Comment: social   Drug use: No     Teresa Almarie LABOR, PA-C 05/21/24 1356  "

## 2024-06-08 ENCOUNTER — Encounter: Admitting: Family Medicine
